# Patient Record
Sex: Male | Born: 1975
Health system: Southern US, Community
[De-identification: ages and names within clinical notes are randomized; demographics above are authoritative.]

## PROBLEM LIST (undated history)

## (undated) DIAGNOSIS — J301 Allergic rhinitis due to pollen: Secondary | ICD-10-CM

## (undated) DIAGNOSIS — U071 COVID-19: Secondary | ICD-10-CM

## (undated) DIAGNOSIS — I872 Venous insufficiency (chronic) (peripheral): Secondary | ICD-10-CM

## (undated) DIAGNOSIS — J329 Chronic sinusitis, unspecified: Secondary | ICD-10-CM

## (undated) DIAGNOSIS — H9313 Tinnitus, bilateral: Secondary | ICD-10-CM

## (undated) DIAGNOSIS — K4090 Unilateral inguinal hernia, without obstruction or gangrene, not specified as recurrent: Secondary | ICD-10-CM

## (undated) DIAGNOSIS — M25511 Pain in right shoulder: Secondary | ICD-10-CM

## (undated) DIAGNOSIS — R42 Dizziness and giddiness: Secondary | ICD-10-CM

## (undated) DIAGNOSIS — Z860101 Personal history of adenomatous and serrated colon polyps: Secondary | ICD-10-CM

## (undated) DIAGNOSIS — Z86010 Personal history of colonic polyps: Secondary | ICD-10-CM

## (undated) DIAGNOSIS — M722 Plantar fascial fibromatosis: Secondary | ICD-10-CM

## (undated) HISTORY — DX: Personal history of colonic polyps: Z86.010

## (undated) HISTORY — DX: Venous insufficiency (chronic) (peripheral): I87.2

## (undated) HISTORY — PX: TONSILLECTOMY: SUR1361

## (undated) HISTORY — DX: Dizziness and giddiness: R42

## (undated) HISTORY — PX: FOOT SURGERY: SHX648

## (undated) HISTORY — PX: ADENOIDECTOMY: SUR15

## (undated) HISTORY — DX: Pain in right shoulder: M25.511

## (undated) HISTORY — DX: Tinnitus, bilateral: H93.13

## (undated) HISTORY — DX: Plantar fascial fibromatosis: M72.2

## (undated) HISTORY — DX: Unilateral inguinal hernia, without obstruction or gangrene, not specified as recurrent: K40.90

## (undated) HISTORY — DX: Allergic rhinitis due to pollen: J30.1

## (undated) HISTORY — DX: Personal history of adenomatous and serrated colon polyps: Z86.0101

## (undated) HISTORY — DX: Chronic sinusitis, unspecified: J32.9

---
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
## 2014-01-10 ENCOUNTER — Encounter: Payer: Self-pay | Admitting: Family Medicine

## 2014-01-10 ENCOUNTER — Ambulatory Visit (INDEPENDENT_AMBULATORY_CARE_PROVIDER_SITE_OTHER): Payer: BC Managed Care – PPO | Admitting: Family Medicine

## 2014-01-10 VITALS — BP 168/77 | HR 67 | Temp 97.9°F | Resp 18 | Ht 71.0 in | Wt 194.0 lb

## 2014-01-10 DIAGNOSIS — Z23 Encounter for immunization: Secondary | ICD-10-CM

## 2014-01-10 DIAGNOSIS — L910 Hypertrophic scar: Secondary | ICD-10-CM

## 2014-01-10 DIAGNOSIS — Z0000 Encounter for general adult medical examination without abnormal findings: Secondary | ICD-10-CM | POA: Insufficient documentation

## 2014-01-10 NOTE — Progress Notes (Signed)
Pre visit review using our clinic review tool, if applicable. No additional management support is needed unless otherwise documented below in the visit note. 

## 2014-01-10 NOTE — Assessment & Plan Note (Signed)
Reviewed age and gender appropriate health maintenance issues (prudent diet, regular exercise, health risks of tobacco and excessive alcohol, use of seatbelts, fire alarms in home, use of sunscreen).  Also reviewed age and gender appropriate health screening as well as vaccine recommendations. Fasting lipid panel and CMET has been ordered for future/[facility]. Tdap and flu vaccine IM today. Referral to dermatology was made due to pt's concern regarding a gradually growing keloid on upper chest wall.

## 2014-01-10 NOTE — Progress Notes (Signed)
Office Note 01/10/2014  CC:  Chief Complaint  Patient presents with  . Establish Care    HPI:  John Henderson is a 38 y.o. White male who is here to establish care. Patient's most recent primary MD: none Old records were not reviewed prior to or during today's visit.  No acute complaints, just wants to get established, get a "baseline physical exam and labs". Denies any past hx of chronic med problems or abnormal lab work in the past.  Past Medical History  Diagnosis Date  . Recurrent sinusitis   . Hay fever     No hx of asthma    Past Surgical History  Procedure Laterality Date  . Tonsillectomy  approx 2005  . Adenoidectomy    . Foot surgery Right     tendon re-attachment after laceration    Family History  Problem Relation Age of Onset  . Bladder Cancer Father     History   Social History  . Marital Status: Married    Spouse Name: N/A    Number of Children: N/A  . Years of Education: N/A   Occupational History  . Not on file.   Social History Main Topics  . Smoking status: Never Smoker   . Smokeless tobacco: Never Used  . Alcohol Use: Yes     Comment: socially  . Drug Use: No  . Sexual Activity: Not on file   Other Topics Concern  . Not on file   Social History Narrative   Married, 2 sons (1 girl on the way as of 12/2013).  Two brothers: healthy.   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   College: Bachelors degree from [employer]   No tob, occ soc alcohol.  No drugs.   Hobbies: fishing, cooking, exercise/golf   MEDS: none today  No Known Allergies  ROS Review of Systems  Constitutional: Negative for fever, chills, appetite change and fatigue.  HENT: Negative for congestion, dental problem, ear pain and sore throat.   Eyes: Negative for discharge, redness and visual disturbance.  Respiratory: Negative for cough, chest tightness, shortness of breath and wheezing.   Cardiovascular: Negative for chest pain, palpitations and leg  swelling.  Gastrointestinal: Negative for nausea, vomiting, abdominal pain, diarrhea and blood in stool.  Genitourinary: Negative for dysuria, urgency, frequency, hematuria, flank pain and difficulty urinating.  Musculoskeletal: Negative for arthralgias, back pain, joint swelling, myalgias and neck stiffness.  Skin: Negative for pallor and rash.       Gradually enlarging scar on upper chest  Neurological: Negative for dizziness, speech difficulty, weakness and headaches.  Hematological: Negative for adenopathy. Does not bruise/bleed easily.  Psychiatric/Behavioral: Negative for confusion and sleep disturbance. The patient is not nervous/anxious.      PE; Blood pressure 168/77, pulse 67, temperature 97.9 F (36.6 C), temperature source Temporal, resp. rate 18, height 5\' 11"  (1.803 m), weight 194 lb (87.998 kg), SpO2 98.00%. Gen: Alert, well appearing.  Patient is oriented to person, place, time, and situation. AFFECT: pleasant, lucid thought and speech. ENT: Ears: EACs clear, normal epithelium.  TMs with good light reflex and landmarks bilaterally.  Eyes: no injection, icteris, swelling, or exudate.  EOMI, PERRLA. Nose: no drainage or turbinate edema/swelling.  No injection or focal lesion.  Mouth: lips without lesion/swelling.  Oral mucosa pink and moist.  Dentition intact and without obvious caries or gingival swelling.  Oropharynx without erythema, exudate, or swelling.  Neck: supple/nontender.  No LAD, mass, or TM.  Carotid pulses 2+ bilaterally, without  bruits. CV: RRR, no m/r/g.   LUNGS: CTA bilat, nonlabored resps, good aeration in all lung fields. ABD: soft, NT, ND, BS normal.  No hepatospenomegaly or mass.  No bruits. EXT: no clubbing, cyanosis, or edema.  Musculoskeletal: no joint swelling, erythema, warmth, or tenderness.  ROM of all joints intact. Skin - no sores or suspicious lesions or rashes or color changes. Upper chest wall with 3 cm long x 1cm wide hypertrophic scar, pinkish  color.  Pertinent labs:  None today  ASSESSMENT AND PLAN:   New pt: no old records to obtain.  Health maintenance examination Reviewed age and gender appropriate health maintenance issues (prudent diet, regular exercise, health risks of tobacco and excessive alcohol, use of seatbelts, fire alarms in home, use of sunscreen).  Also reviewed age and gender appropriate health screening as well as vaccine recommendations. Fasting lipid panel and CMET has been ordered for future/[facility]. Tdap and flu vaccine IM today. Referral to dermatology was made due to pt's concern regarding a gradually growing keloid on upper chest wall.  An After Visit Summary was printed and given to the patient.  Return for 1 yr for CPE.

## 2015-01-14 ENCOUNTER — Ambulatory Visit (INDEPENDENT_AMBULATORY_CARE_PROVIDER_SITE_OTHER): Payer: BLUE CROSS/BLUE SHIELD

## 2015-01-14 ENCOUNTER — Ambulatory Visit (INDEPENDENT_AMBULATORY_CARE_PROVIDER_SITE_OTHER): Payer: BLUE CROSS/BLUE SHIELD | Admitting: Podiatry

## 2015-01-14 DIAGNOSIS — M722 Plantar fascial fibromatosis: Secondary | ICD-10-CM

## 2015-01-14 DIAGNOSIS — M79672 Pain in left foot: Secondary | ICD-10-CM

## 2015-01-14 DIAGNOSIS — M79671 Pain in right foot: Secondary | ICD-10-CM

## 2015-01-14 NOTE — Patient Instructions (Signed)

## 2015-01-14 NOTE — Progress Notes (Signed)
   Subjective:    Patient ID: John Henderson, male    DOB: March 20, 1976, 39 y.o.   MRN: [id number]  HPI Pt presents with bilateral PF, seeking second opinion and other options, has tried ice, cortisone injections, orthotics and stretching and night splint   Review of Systems  All other systems reviewed and are negative.      Objective:   Physical Exam        Assessment & Plan:

## 2015-01-16 NOTE — Progress Notes (Signed)
Subjective:     Patient ID: John Henderson, male   DOB: Dec 21, 1975, 39 y.o.   MRN: [id number]  HPI patient presents stating I have had heel pain in both heels of long-term nature mild degree and I was unsure what might be a good alternative for me. It does not hurt severely but it can bother me if I been active for too long time   Review of Systems  All other systems reviewed and are negative.      Objective:   Physical Exam  Constitutional: He is oriented to person, place, and time.  Cardiovascular: Intact distal pulses.   Musculoskeletal: Normal range of motion.  Neurological: He is oriented to person, place, and time.  Skin: Skin is warm.  Nursing note and vitals reviewed.  neurovascular status found to be intact with muscle strength adequate and range of motion subtalar midtarsal joint within normal limits. Patient has a fairly narrow foot type with stress occurring against the heels and moderate depression of the arch noted upon evaluation     Assessment:     Plantar fasciitis of a moderate nature bilateral with inflammation at the insertion    Plan:     H&P and x-rays reviewed and today I advised on different forms of physical therapy continued orthotic usage as he just received them and if symptoms continue to persist we will need to consider shockwave therapy or other modalities. Patient will be seen back to reevaluate

## 2015-01-22 ENCOUNTER — Ambulatory Visit: Payer: BLUE CROSS/BLUE SHIELD | Admitting: Podiatry

## 2015-09-27 ENCOUNTER — Encounter: Payer: Self-pay | Admitting: Family Medicine

## 2015-09-27 ENCOUNTER — Other Ambulatory Visit: Payer: Self-pay | Admitting: Family Medicine

## 2015-09-27 ENCOUNTER — Ambulatory Visit (INDEPENDENT_AMBULATORY_CARE_PROVIDER_SITE_OTHER): Payer: BLUE CROSS/BLUE SHIELD | Admitting: Family Medicine

## 2015-09-27 VITALS — BP 120/69 | HR 57 | Temp 98.1°F | Resp 20 | Ht 71.0 in | Wt 189.5 lb

## 2015-09-27 DIAGNOSIS — N451 Epididymitis: Secondary | ICD-10-CM | POA: Diagnosis not present

## 2015-09-27 DIAGNOSIS — N50819 Testicular pain, unspecified: Secondary | ICD-10-CM

## 2015-09-27 DIAGNOSIS — R109 Unspecified abdominal pain: Secondary | ICD-10-CM | POA: Diagnosis not present

## 2015-09-27 LAB — POCT URINALYSIS DIPSTICK
BILIRUBIN UA: NEGATIVE
Blood, UA: NEGATIVE
Glucose, UA: NEGATIVE
KETONES UA: NEGATIVE
LEUKOCYTES UA: NEGATIVE
Nitrite, UA: NEGATIVE
PROTEIN UA: NEGATIVE
Spec Grav, UA: 1.015
Urobilinogen, UA: 0.2
pH, UA: 7

## 2015-09-27 MED ORDER — LEVOFLOXACIN 500 MG PO TABS: 500.0000 mg | ORAL_TABLET | Freq: Every day | ORAL | Status: DC

## 2015-09-27 MED ORDER — CEFTRIAXONE SODIUM 1 G IJ SOLR
250.0000 mg | Freq: Once | INTRAMUSCULAR | Status: AC
  Administered 2015-09-27: 250 mg via INTRAMUSCULAR

## 2015-09-27 NOTE — Patient Instructions (Signed)
Start  Antibiotics, this should make your symptoms better unless it i referred pain from a small hernia. If your symptoms are unchanged I want to see you next week, get imaging and send to urology or surgery.

## 2015-09-27 NOTE — Progress Notes (Signed)
Subjective:    Patient ID: John Henderson, male    DOB: [birth year], 39 y.o.   MRN: [id number]  HPI  Testicular discomfort: Patient reports for an acute office visit with 2-3 weeks of "soreness" of his right testes. He states it is a dull intermittent pain. He also endorses mild lower lumbar pain, but is uncertain if this is related as he has had prior history of low back pain. He states sometimes his testicular discomfort is worse when sitting. He feels that he has pressure with urination, and incomplete emptying. He denies any changes into his stream, he denies any burning with urination. He has no history of kidney stones or prior urinary tract infections. He has no recent history of STDs, and is married and in a monogamous relationship. He does have a history of prostatitis years ago. His father had bladder cancer at approximately age 63. He reports mainly feeling a bump on the posterior end of his right testicle. He denies any changes in sexual partners, or penile discharge or lesions. Patient denies any testicular swelling or redness. Patient denies any fevers. Patient denies any trauma or heavy lifting prior to onset of discomfort. Patient denies any pain or difficulty with sexual intercourse.  Past Medical History  Diagnosis Date  . Recurrent sinusitis   . Hay fever     No hx of asthma   No Known Allergies Past Surgical History  Procedure Laterality Date  . Tonsillectomy  approx 2005  . Adenoidectomy    . Foot surgery Right     tendon re-attachment after laceration   Social History   Social History  . Marital Status: Married    Spouse Name: N/A  . Number of Children: N/A  . Years of Education: N/A   Occupational History  . Not on file.   Social History Main Topics  . Smoking status: Never Smoker   . Smokeless tobacco: Never Used  . Alcohol Use: Yes     Comment: socially  . Drug Use: No  . Sexual Activity: Not on file   Other Topics Concern  . Not on file    Social History Narrative   Married, 2 sons (1 girl on the way as of 12/2013).  Two brothers: healthy.   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   College: Bachelors degree from [employer].   No tob, occ soc alcohol.  No drugs.   Hobbies: fishing, cooking, exercise/golf    Review of Systems Negative, with the exception of above mentioned in HPI     Objective:   Physical Exam BP 120/69 mmHg  Pulse 57  Temp(Src) 98.1 F (36.7 C) (Oral)  Resp 20  Ht  (1.803 m)  Wt 189 lb 8 oz (85.957 kg)  BMI 26.44 kg/m2  SpO2 99% Gen: Afebrile. No acute distress. Nontoxic in appearance, well-developed, well-nourished, Caucasian male. Very pleasant. HENT: AT. [place].  MMM. Eyes:Pupils Equal Round Reactive to light, Extraocular movements intact,  Conjunctiva without redness, discharge or icterus. Abd: Soft. Flat. NTND. Mild right inguinal fullness, with mild tender to palpation deep inguinal area. BS  present.  GU: Penile exam normal, no lesions, no erythema, no deformity. Bilateral testes without swelling, erythema. Mild tenderness to palpation posterior right testicle. ? Small right inguinal hernia, mild fullness right inguinal area. No chandelier sign. Normal prostate, smooth, not enlarged. Bilateral Cremasteric reflex intact     Assessment & Plan:  1. Flank pain - Uncertain etiology of flank pain/testicular pain. Palpated very small tender  area posterior right testicle could be consistent with mild epididymitis. Urine dipstick today was normal. We'll send for culture regardless as well as urine cytology. No alarming symptoms on exam today. Discussed treatment with antibiotics to see if improvement in symptoms. Discussed if he did not see any improvement in symptoms within the next few days on antibiotics, we would want to send to urology and consider imaging versus surgical referral for possible small right inguinal hernia. - POCT Urinalysis Dipstick - Urine Culture - Urinalysis, Routine  w reflex microscopic ([facility]) - Cytology - non gyn  2. Testicle pain - Considering epididymitis versus referred pain from possible small inguinal hernia. See above for plan. - Urine Culture - Urinalysis, Routine w reflex microscopic (not [facility]) - Cytology - non gyn  3. Epididymitis - levofloxacin (LEVAQUIN) 500 MG tablet; Take 1 tablet (500 mg total) by mouth daily.  Dispense: 10 tablet; Refill: 0 - cefTRIAXone (ROCEPHIN) injection 250 mg; Inject 0.25 g (250 mg total) into the muscle once.

## 2015-09-28 LAB — URINALYSIS, ROUTINE W REFLEX MICROSCOPIC
BILIRUBIN URINE: NEGATIVE
GLUCOSE, UA: NEGATIVE
HGB URINE DIPSTICK: NEGATIVE
Ketones, ur: NEGATIVE
Leukocytes, UA: NEGATIVE
Nitrite: NEGATIVE
PROTEIN: NEGATIVE
Specific Gravity, Urine: 1.014 (ref 1.001–1.035)
pH: 7 (ref 5.0–8.0)

## 2015-09-28 LAB — URINE CULTURE
COLONY COUNT: NO GROWTH
ORGANISM ID, BACTERIA: NO GROWTH

## 2015-09-30 ENCOUNTER — Telehealth: Payer: Self-pay | Admitting: Family Medicine

## 2015-09-30 LAB — CYTOLOGY - NON PAP

## 2015-09-30 NOTE — Telephone Encounter (Signed)
Please call patient, his urine studies were normal.

## 2015-10-01 NOTE — Telephone Encounter (Signed)
Reviewed results with patient. Patient states he has had some improvement in his Sx . He will continue to take his antibiotic as directed.

## 2015-11-11 ENCOUNTER — Encounter: Payer: Self-pay | Admitting: Family Medicine

## 2015-11-11 ENCOUNTER — Ambulatory Visit (INDEPENDENT_AMBULATORY_CARE_PROVIDER_SITE_OTHER): Payer: BLUE CROSS/BLUE SHIELD | Admitting: Family Medicine

## 2015-11-11 VITALS — BP 118/75 | HR 62 | Temp 98.0°F | Resp 16 | Ht 71.0 in | Wt 187.0 lb

## 2015-11-11 DIAGNOSIS — Z23 Encounter for immunization: Secondary | ICD-10-CM | POA: Diagnosis not present

## 2015-11-11 DIAGNOSIS — R229 Localized swelling, mass and lump, unspecified: Secondary | ICD-10-CM | POA: Diagnosis not present

## 2015-11-11 NOTE — Progress Notes (Signed)
OFFICE VISIT  11/11/2015   CC:  Chief Complaint  Patient presents with  . Cyst    few located on abdomin x 6 months, not painfull     HPI:    Patient is a 39 y.o. Caucasian male who presents for "knots on abdomen". First noted about 6 mo ago x 1 in subQ area of RUQ, no pain, no skin abnormalities, not enlarging--in fact, often hard to feel at all sometimes per pt.  Another one recently palpated by pt in R mid abdomen, nontender, no skin abnormalities.  Not enlarging.  No f/c/malaise.     Past Medical History  Diagnosis Date  . Recurrent sinusitis   . Hay fever     No hx of asthma    Past Surgical History  Procedure Laterality Date  . Tonsillectomy  approx 2005  . Adenoidectomy    . Foot surgery Right     tendon re-attachment after laceration   MEDS: none  No Known Allergies  ROS As per HPI  PE: Blood pressure 118/75, pulse 62, temperature 98 F (36.7 C), temperature source Oral, resp. rate 16, height 5\' 11"  (1.803 m), weight 187 lb (84.823 kg), SpO2 94 %. Gen: Alert, well appearing.  Patient is oriented to person, place, time, and situation. SKIN: abd wall with two pea-sized subQ nodular lesions that are barely discernable from the surrounding tissue upon palpation.  Nontender.  They are not fluctuant.    LABS:  none  IMPRESSION AND PLAN:  Subcutaneous soft tissue lumps x 2, most c/w either small epidermal inclusion cysts or lipomas or neuromas. No sign of infection or worrisome growth/mass. Reassured pt.  Watchful waiting/monitoring. Signs/symptoms to call or return for were reviewed and pt expressed understanding.  An After Visit Summary was printed and given to the patient.  FOLLOW UP: Return if symptoms worsen or fail to improve.

## 2015-11-11 NOTE — Progress Notes (Signed)
Pre visit review using our clinic review tool, if applicable. No additional management support is needed unless otherwise documented below in the visit note. 

## 2015-12-05 ENCOUNTER — Encounter: Payer: Self-pay | Admitting: Family Medicine

## 2015-12-05 ENCOUNTER — Ambulatory Visit (INDEPENDENT_AMBULATORY_CARE_PROVIDER_SITE_OTHER): Payer: BLUE CROSS/BLUE SHIELD | Admitting: Family Medicine

## 2015-12-05 VITALS — BP 122/75 | HR 59 | Temp 98.2°F | Resp 20 | Wt 190.0 lb

## 2015-12-05 DIAGNOSIS — J069 Acute upper respiratory infection, unspecified: Secondary | ICD-10-CM | POA: Diagnosis not present

## 2015-12-05 DIAGNOSIS — J018 Other acute sinusitis: Secondary | ICD-10-CM

## 2015-12-05 MED ORDER — AMOXICILLIN 875 MG PO TABS: 875.0000 mg | ORAL_TABLET | Freq: Two times a day (BID) | ORAL | Status: AC

## 2015-12-05 MED ORDER — PREDNISONE 20 MG PO TABS: ORAL_TABLET | ORAL | Status: DC

## 2015-12-05 NOTE — Progress Notes (Signed)
OFFICE NOTE  12/05/2015  CC:  Chief Complaint  Patient presents with  . Sinusitis   HPI: Patient is a 39 y.o. Caucasian male who is here for respiratory complaints. Onset about 3 d/a, nasal congestion, runny nose, sneezing, cough of PND mucous, some hoarseness. No ST.  No fever.  Feels fatigued.  No body aches.  No rash.  Appetite down a bit. Trying nasacort, neti pot qd.  No antihist or decong.  No facial or upper teeth pain or HA. Pt says sx's are severe compared to his usual cold/allergies flare.    Pertinent PMH:  Past medical, surgical, social, and family history reviewed and no changes are noted since last office visit.  MEDS:  No chronic meds  PE: Blood pressure 122/75, pulse 59, temperature 98.2 F (36.8 C), resp. rate 20, weight 190 lb (86.183 kg), SpO2 97 %. VS: noted--normal. Gen: alert, NAD, NONTOXIC APPEARING. HEENT: eyes without injection, drainage, or swelling.  Ears: EACs clear, TMs with normal light reflex and landmarks.  Nose: Clear rhinorrhea, with some dried, crusty exudate adherent to mildly injected mucosa.  No purulent d/c.  No paranasal sinus TTP.  No facial swelling.  Throat and mouth without focal lesion.  No pharyngial swelling, erythema, or exudate.   Neck: supple, no LAD.   LUNGS: CTA bilat, nonlabored resps.   CV: RRR, no m/r/g. EXT: no c/c/e SKIN: no rash    IMPRESSION AND PLAN:  Severe URI/acute sinusitis. Encouraged pt to continue symptomatic care and give this more time. If not improving any in 4-5 days, he can fill amoxil 875mg  bid x 10d and prednisone 20mg  qd x 3d, then 10mg  qd x 4d. Therapeutic expectations and side effect profile of medication discussed today.  Patient's questions answered. Signs/symptoms to call or return for were reviewed and pt expressed understanding.  An After Visit Summary was printed and given to the patient.  FOLLOW UP: prn

## 2016-04-15 DIAGNOSIS — M71572 Other bursitis, not elsewhere classified, left ankle and foot: Secondary | ICD-10-CM | POA: Diagnosis not present

## 2016-04-15 DIAGNOSIS — M722 Plantar fascial fibromatosis: Secondary | ICD-10-CM | POA: Diagnosis not present

## 2016-04-15 DIAGNOSIS — M71571 Other bursitis, not elsewhere classified, right ankle and foot: Secondary | ICD-10-CM | POA: Diagnosis not present

## 2016-06-15 ENCOUNTER — Ambulatory Visit (INDEPENDENT_AMBULATORY_CARE_PROVIDER_SITE_OTHER): Payer: BLUE CROSS/BLUE SHIELD | Admitting: Family Medicine

## 2016-06-15 ENCOUNTER — Encounter: Payer: Self-pay | Admitting: Family Medicine

## 2016-06-15 VITALS — BP 100/62 | HR 79 | Temp 97.9°F | Resp 16 | Ht 71.0 in | Wt 187.5 lb

## 2016-06-15 DIAGNOSIS — L239 Allergic contact dermatitis, unspecified cause: Secondary | ICD-10-CM

## 2016-06-15 MED ORDER — PREDNISONE 20 MG PO TABS: ORAL_TABLET | ORAL | Status: DC

## 2016-06-15 NOTE — Patient Instructions (Signed)
Use otc generic benadryl (25-50 mg) every 6 hours for itching.  For anti-itch medication that won't cause drowsiness, try OTC generic allegra 180 mg.  For treatment of fungal rash in groin, use otc generic lamisil cream: follow instructions on packaging.

## 2016-06-15 NOTE — Progress Notes (Signed)
OFFICE VISIT  06/15/2016   CC:  Chief Complaint  Patient presents with  . Rash    x 1 day     HPI:    Patient is a 40 y.o. Caucasian male who presents for rash, very itchy. Onset yesterday.  He had recently been cutting down a tree with poison ivy vine on it. Son was doing this with him and son has similar rash.  No fevers, no malaise.  No joint swelling or swelling of eyes, tongue, lips, or throat.   Past Medical History  Diagnosis Date  . Recurrent sinusitis   . Hay fever     No hx of asthma    Past Surgical History  Procedure Laterality Date  . Tonsillectomy  approx 2005  . Adenoidectomy    . Foot surgery Right     tendon re-attachment after laceration    Outpatient Prescriptions Prior to Visit  Medication Sig Dispense Refill  . predniSONE (DELTASONE) 20 MG tablet 1 tab po qd x 3d, then 1/2 tab po qd x 4d (Patient not taking: Reported on 06/15/2016) 5 tablet 0   No facility-administered medications prior to visit.    No Known Allergies  ROS As per HPI  PE: Blood pressure 100/62, pulse 79, temperature 97.9 F (36.6 C), temperature source Oral, resp. rate 16, height 5\' 11"  (1.803 m), weight 187 lb 8 oz (85.049 kg), SpO2 96 %. Gen: Alert, well appearing.  Patient is oriented to person, place, time, and situation. AFFECT: pleasant, lucid thought and speech.  SKIN: forearms and lower abdomen with diffuse erythematous papular rash.  LABS:  none  IMPRESSION AND PLAN:  Allergic contact dermatitis (poison ivy/oak suspected): Prednisone 40mg  qd x 7d, then 20mg  qd x 7d, then 10mg  qd x 6d. Further instructions: Use otc generic benadryl (25-50 mg) every 6 hours for itching.  For anti-itch medication that won't cause drowsiness, try OTC generic allegra 180 mg.  You may continue to use cutivate cream 0.05%.  An After Visit Summary was printed and given to the patient.  FOLLOW UP: Return if symptoms worsen or fail to improve.  Signed:  Santiago BumpersPhil Sadiel Mota, MD            06/15/2016

## 2016-06-15 NOTE — Progress Notes (Signed)
Pre visit review using our clinic review tool, if applicable. No additional management support is needed unless otherwise documented below in the visit note. 

## 2017-01-26 ENCOUNTER — Telehealth: Payer: Self-pay | Admitting: Family Medicine

## 2017-01-26 ENCOUNTER — Encounter: Payer: Self-pay | Admitting: Family Medicine

## 2017-01-26 ENCOUNTER — Ambulatory Visit (INDEPENDENT_AMBULATORY_CARE_PROVIDER_SITE_OTHER): Payer: BLUE CROSS/BLUE SHIELD | Admitting: Family Medicine

## 2017-01-26 VITALS — BP 119/74 | HR 56 | Temp 98.3°F | Resp 20 | Wt 191.2 lb

## 2017-01-26 DIAGNOSIS — R229 Localized swelling, mass and lump, unspecified: Secondary | ICD-10-CM

## 2017-01-26 DIAGNOSIS — R10816 Epigastric abdominal tenderness: Secondary | ICD-10-CM | POA: Diagnosis not present

## 2017-01-26 NOTE — Telephone Encounter (Signed)
Pt has been scheduled to see Dr. Claiborne BillingsKuneff at 3:15am.

## 2017-01-26 NOTE — Telephone Encounter (Signed)
°  Patient Name: John Henderson  DOB: [birth year]    Initial Comment Caller states that he is having chest pain and pressure x 2 weeks   Nurse Assessment  Nurse: Renaldo FiddlerAdkins, RN, Raynelle FanningJulie Date/Time [name](Eastern Time): 01/26/2017 9:55:13 AM  Confirm and document reason for call. If symptomatic, describe symptoms. ---Caller states that he has been having pressure and discomfort under his sternum for the last 2- 3 weeks. States he can see( with deep breath" and feel a protrusion the size of a golf ball. Denies chest pain or difficulty breathing. Adds it has not affected his activity, he has been working out, lifting weights, running, it just feels "weird".  Does the patient have any new or worsening symptoms? ---Yes  Will a triage be completed? ---Yes  Related visit to physician within the last 2 weeks? ---No  Does the PT have any chronic conditions? (i.e. diabetes, asthma, etc.) ---No  Is this a behavioral health or substance abuse call? ---No     Guidelines    Guideline Title Affirmed Question Affirmed Notes  Abdominal Pain - Upper [1] MODERATE pain (e.g., interferes with normal activities) AND [2] comes and goes (cramps) AND [3] present > 24 hours (Exception: pain with Vomiting or Diarrhea - see that Guideline)    Final Disposition User   See Physician within 24 Hours Renaldo FiddlerAdkins, RN, Raynelle FanningJulie    Referrals  REFERRED TO PCP OFFICE   Disagree/Comply: Danella Maiersomply

## 2017-01-26 NOTE — Patient Instructions (Signed)
Lipoma Introduction A lipoma is a noncancerous (benign) tumor that is made up of fat cells. This is a very common type of soft-tissue growth. Lipomas are usually found under the skin (subcutaneous). They may occur in any tissue of the body that contains fat. Common areas for lipomas to appear include the back, shoulders, buttocks, and thighs. Lipomas grow slowly, and they are usually painless. Most lipomas do not cause problems and do not require treatment. What are the causes? The cause of this condition is not known. What increases the risk? This condition is more likely to develop in:  People who are 59-69 years old.  People who have a family history of lipomas. What are the signs or symptoms? A lipoma usually appears as a small, round bump under the skin. It may feel soft or rubbery, but the firmness can vary. Most lipomas are not painful. However, a lipoma may become painful if it is located in an area where it pushes on nerves. How is this diagnosed? A lipoma can usually be diagnosed with a physical exam. You may also have tests to confirm the diagnosis and to rule out other conditions. Tests may include:  Imaging tests, such as a CT scan or MRI.  Removal of a tissue sample to be looked at under a microscope (biopsy). How is this treated? Treatment is not needed for small lipomas that are not causing problems. If a lipoma continues to get bigger or it causes problems, removal is often the best option. Lipomas can also be removed to improve appearance. Removal of a lipoma is usually done with a surgery in which the fatty cells and the surrounding capsule are removed. Most often, a medicine that numbs the area (local anesthetic) is used for this procedure. Follow these instructions at home:  Keep all follow-up visits as directed by your health care provider. This is important. Contact a health care provider if:  Your lipoma becomes larger or hard.  Your lipoma becomes painful, red, or  increasingly swollen. These could be signs of infection or a more serious condition. This information is not intended to replace advice given to you by your health care provider. Make sure you discuss any questions you have with your health care provider. Document Released: 11/13/2002 Document Revised: 04/30/2016 Document Reviewed: 11/19/2014  2017 Elsevier Thoracic Outlet Syndrome Thoracic outlet syndrome (TOS) is a group of signs and symptoms that result when the vein, artery, or nerves that supply your arm and hand are squeezed (compressed). To reach your arm, all of these have to pass through a tight space under your collarbone and above your top rib (thoracic outlet). There are three types of TOS:  Compression of the nerves that supply your arm and hand is called neurogenic TOS. Most people with TOS have this type.  Compression of the vein that returns blood from your arm and hand (subclavian vein) is called venous TOS.  Compression of the artery that carries blood to your arm and hand (subclavian artery) is called arterial TOS. Arterial TOS is the rarest type. Depending on which structures are affected, you may have symptoms on one side or both sides of your body. CAUSES  Neurogenic TOS may be caused by swelling or scarring in your neck muscles that results in the narrowing of your thoracic outlet. This leads to nerve compression. It can happen from:  Neck injuries from an auto accident (whiplash).  Falls.  Repetitive stress on your neck from working with your arms. This stress could  be from using a keyboard all day or working on an Theatre stage [employer].  Venous TOS may be caused by doing hard work with your arms, especially if you have to lift your arms above your head. A blood clot may form in the vein.  Arterial TOS may be caused by having an extra rib at the base of your neck (cervical rib). This rib presses on your subclavian artery. Over time, this pressure may cause a clot to form  inside the artery, or the artery may weaken and balloon outward (aneurysm). RISK FACTORS  You may be at greater risk for neurogenic TOS after a neck injury or repetitive stress on your neck.  You may be at greater risk for venous TOS if you do strenuous and repetitive work with your arms.  You may be at greater risk for arterial TOS if you were born with a cervical rib. Risk factors for any type of TOS include:  Being male.  Being overweight.  Having poor posture. SIGNS AND SYMPTOMS  Your signs and symptoms will depend on the type of TOS that you have. Signs and symptoms of neurogenic TOS may include:  Pain in your shoulder, arm, or hand.  Tingling or numbness in your shoulder, arm, or hand.  Tiredness or weakness of your shoulder, arm, or hand.  Neck pain.  Headache. Signs and symptoms of venous TOS may include:  Pain and swelling of your whole arm.  Arm skin that is darker than usual. Signs and symptoms of arterial TOS may include:  Pain and cramps in your arm or hand.  Pale arm skin.  Very cold hands. All signs and symptoms of TOS may be worse when you hold your arms over your head. DIAGNOSIS Your health care provider may suspect TOS from your symptoms. A physical exam will be done. During the exam, your health care provider may ask you to hold your arms over your head to check whether your symptoms get worse. Tests may also be done to confirm the diagnosis and to find out what is causing TOS. These may include:  Imaging studies, such as:  X-rays to look for a cervical rib.  A test using sound waves to create an image (ultrasound).  CT scan.  MRI.  A test that involves measuring and recording the pulses in your wrists (pulse volume recording).  A test that involves measuring the conduction speed of nerve impulses in your arm (nerve conduction velocity test).  A test in which X-rays are done after dye is injected into your subclavian artery or vein  (venography or arteriography). TREATMENT  Treatment depends on the type of TOS that you have.  Neurogenic TOS may be treated with:  Physical therapy to learn stretching exercises and good posture.  Occupational therapy to improve your workplace and home environment.  Medicine, including pain medicine, muscle relaxants, and anti-inflammatory medicine.  Surgery to remove scarred neck muscles or the first rib. This is rarely done for this type of TOS.  Venous TOS may be treated with:  Medicine, including blood thinners or blood clot dissolvers.  Surgery to remove a blood clot.  Surgery to remove the uppermost rib to make more space in the thoracic outlet.  Arterial TOS may be treated with surgery to:  Remove the cervical rib.  Remove a blood clot (thrombus).  Repair an aneurysm. HOME CARE INSTRUCTIONS  Take medicines only as directed by your health care provider.  Maintain a healthy weight. Lose weight as directed by your  health care provider.  Do stretching exercises at home as directed by your health care provider or physical therapist.  Maintain good posture.  Do not carry heavy bags over your shoulder.  Do not repetitively lift heavy objects over your head.  Take frequent breaks to stretch and rest your arms if you work at a keyboard or do other repetitive work with your hands and arms.  Keep all follow-up visits as directed by your health care provider. This is important. SEEK MEDICAL CARE IF:  You have pain, cramps, numbness, or tingling in your arm or hand.  Your arm or hand frequently feels tired.  Your arm develops a darker skin color than usual.  Your hand feels cold.  You have frequent headaches or neckaches. SEEK IMMEDIATE MEDICAL CARE IF:   You lose feeling in your arm or hand.  You are unable to move your fingers.  Your fingers turn a dark color. This information is not intended to replace advice given to you by your health care provider.  Make sure you discuss any questions you have with your health care provider. Document Released: 11/13/2002 Document Revised: 12/14/2014 Document Reviewed: 04/25/2014 Elsevier Interactive Patient Education  2017 [employer]

## 2017-01-26 NOTE — Progress Notes (Signed)
    John Henderson , 10-19-1976, 41 y.o., male MRN: [id number] Patient Care Team    Relationship Specialty Notifications Start End  Jeoffrey MassedPhilip H McGowen, MD PCP - General Family Medicine  01/10/14   Cherlyn RobertsFrederick Lupton, MD Consulting Physician Dermatology  11/11/15     CC: epigastric pain  Subjective: Pt presents for an acute OV with complaints of epigastric pain  of 3-4 weeks duration.  Associated symptoms include tenderness to palpation and a knot at the location.  He denies heartburn, chest pain, nausea, vomit, decreased appetite, increased symptoms with association of meal. He denies fever or chills.   He also states he has a few skin nodules that he asked about before and was given reassurance, that he was wondering about. It reports they are small, non-tender and present for many years.  No Known Allergies Social History  Substance Use Topics  . Smoking status: Never Smoker  . Smokeless tobacco: Never Used  . Alcohol use Yes     Comment: socially   Past Medical History:  Diagnosis Date  . Hay fever    No hx of asthma  . Recurrent sinusitis    Past Surgical History:  Procedure Laterality Date  . ADENOIDECTOMY    . FOOT SURGERY Right    tendon re-attachment after laceration  . TONSILLECTOMY  approx 2005   Family History  Problem Relation Age of Onset  . Bladder Cancer Father    Allergies as of 01/26/2017   No Known Allergies     Medication List    as of 01/26/2017  3:22 PM   You have not been prescribed any medications.     No results found for this or any previous visit (from the past 24 hour(s)). No results found.   ROS: Negative, with the exception of above mentioned in HPI   Objective:  BP 119/74 (BP Location: Left Arm, Patient Position: Sitting, Cuff Size: Large)   Pulse (!) 56   Temp 98.3 F (36.8 C)   Resp 20   Wt 191 lb 4 oz (86.8 kg)   SpO2 98%   BMI 26.67 kg/m  Body mass index is 26.67 kg/m. Gen: Afebrile. No acute distress. Nontoxic in  appearance, well developed, well nourished.  HENT: AT. [place].  MMM Eyes:Pupils Equal Round Reactive to light, Extraocular movements intact,  Conjunctiva without redness, discharge or icterus. CV: RRR  Chest: CTAB, no wheeze or crackles.  Abd: Soft. Flat. ND. Very mild TTP posterior to xiphoid process. BS present. no Masses palpated. No rebound or guarding.  Skin: no skin irritation or erythema, very small hard mobile < pea size nodules x3. No tenderness.    Assessment/Plan: John RhodyBernard Henderson is a 41 y.o. male present for acute OV for  Epigastric abdominal tenderness without rebound tenderness - mild tenderness just posterior to xiphoid process. Do not think this concerning or formation of a hernia etc. He has no other symptoms. If it is a a hernia it is very small and would not suggest further investigation unless worsening. Increase discomfort is likely from him pushing on it.   Skin nodule x3 - reassured, possible small lipoma vs fat necrosis/area of prior trauma.    electronically signed by:  Felix Pacinienee Kaipo Ardis, DO  [facility] - [place]

## 2017-04-21 ENCOUNTER — Ambulatory Visit (INDEPENDENT_AMBULATORY_CARE_PROVIDER_SITE_OTHER): Payer: BLUE CROSS/BLUE SHIELD | Admitting: Family Medicine

## 2017-04-21 ENCOUNTER — Encounter: Payer: Self-pay | Admitting: Family Medicine

## 2017-04-21 VITALS — BP 110/70 | HR 53 | Temp 98.0°F | Resp 20 | Wt 188.0 lb

## 2017-04-21 DIAGNOSIS — W57XXXA Bitten or stung by nonvenomous insect and other nonvenomous arthropods, initial encounter: Secondary | ICD-10-CM

## 2017-04-21 DIAGNOSIS — S30861A Insect bite (nonvenomous) of abdominal wall, initial encounter: Secondary | ICD-10-CM | POA: Diagnosis not present

## 2017-04-21 MED ORDER — DOXYCYCLINE HYCLATE 100 MG PO TABS: 100.0000 mg | ORAL_TABLET | Freq: Two times a day (BID) | ORAL | 0 refills | Status: DC

## 2017-04-21 NOTE — Progress Notes (Signed)
John Henderson , 1976/11/05, 41 y.o., male MRN: [id number] Patient Care Team    Relationship Specialty Notifications Start End  McGowen, Maryjean MornPhilip H, MD PCP - General Family Medicine  01/10/14   Cherlyn RobertsLupton, Frederick, MD Consulting Physician Dermatology  11/11/15     Chief Complaint  Patient presents with  . Insect Bite    tick abdomen     Subjective: Pt presents for an OV with complaints of insect bite of 3-4  days duration.  Associated symptoms include itchy,  swelling, redness at site and a red ring around bite. He has seen many ticks on his kids. [place] They have a dog. He denies fever, chills, headache, rash or weakness.   No flowsheet data found.  No Known Allergies Social History  Substance Use Topics  . Smoking status: Never Smoker  . Smokeless tobacco: Never Used  . Alcohol use Yes     Comment: socially   Past Medical History:  Diagnosis Date  . Hay fever    No hx of asthma  . Recurrent sinusitis    Past Surgical History:  Procedure Laterality Date  . ADENOIDECTOMY    . FOOT SURGERY Right    tendon re-attachment after laceration  . TONSILLECTOMY  approx 2005   Family History  Problem Relation Age of Onset  . Bladder Cancer Father    Allergies as of 04/21/2017   No Known Allergies     Medication List    as of 04/21/2017  1:53 PM   You have not been prescribed any medications.     All past medical history, surgical history, allergies, family history, immunizations andmedications were updated in the EMR today and reviewed under the history and medication portions of their EMR.     ROS: Negative, with the exception of above mentioned in HPI   Objective:  BP 110/70 (BP Location: Left Arm, Patient Position: Sitting, Cuff Size: Large)   Pulse (!) 53   Temp 98 F (36.7 C)   Resp 20   Wt 188 lb (85.3 kg)   SpO2 97%   BMI 26.22 kg/m  Body mass index is 26.22 kg/m. Gen: Afebrile. No acute distress. Nontoxic in appearance, well developed,  well nourished.  HENT: AT. [place]. MMM Eyes:Pupils Equal Round Reactive to light, Extraocular movements intact,  Conjunctiva without redness, discharge or icterus. Skin:  no rashes, purpura or petechiinsect bite left of midline lower abd. Redness, swelling of bite with red ring. ae.  Neuro:Normal gait. PERLA. EOMi. Alert. Oriented x3   No exam data present No results found. No results found for this or any previous visit (from the past 24 hour(s)).  Assessment/Plan: John RhodyBernard Henderson is a 41 y.o. male present for OV for  1. Tick bite, initial encounter - given duration outside the window for prophylaxis dose and area does appear to have bulls eye ring with cover with doxy BID for 10 days.  Monitor for additional rash formation, headache, fever.  - F/u PRN    Reviewed expectations re: course of current medical issues.  Discussed self-management of symptoms.  Outlined signs and symptoms indicating need for more acute intervention.  Patient verbalized understanding and all questions were answered.  Patient received an After-Visit Summary.   Note is dictated utilizing voice recognition software. Although note has been proof read prior to signing, occasional typographical errors still can be missed. If any questions arise, please do not hesitate to call for verification.   electronically signed by:  Luster Landsbergenee  Raoul Pitch, DO  [facility] - [place]

## 2017-04-21 NOTE — Patient Instructions (Signed)
Tick Bite Information Introduction Ticks are insects that attach themselves to the skin. There are many types of ticks. Common types include wood ticks and deer ticks. Sometimes, ticks carry diseases that can make a person very ill. The most common places for ticks to attach themselves are the scalp, neck, armpits, waist, and groin. HOW CAN YOU PREVENT TICK BITES? Take these steps to help prevent tick bites when you are outdoors:  Wear long sleeves and long pants.  Wear white clothes so you can see ticks more easily.  Tuck your pant legs into your socks.  If walking on a trail, stay in the middle of the trail to avoid brushing against bushes.  Avoid walking through areas with long grass.  Put bug spray on all skin that is showing and along boot tops, pant legs, and sleeve cuffs.  Check clothes, hair, and skin often and before going inside.  Brush off any ticks that are not attached.  Take a shower or bath as soon as possible after being outdoors. HOW SHOULD YOU REMOVE A TICK? Ticks should be removed as soon as possible to help prevent diseases. 1. If latex gloves are available, put them on before trying to remove a tick. 2. Use tweezers to grasp the tick as close to the skin as possible. You may also use curved forceps or a tick removal tool. Grasp the tick as close to its head as possible. Avoid grasping the tick on its body. 3. Pull gently upward until the tick lets go. Do not twist the tick or jerk it suddenly. This may break off the tick's head or mouth parts. 4. Do not squeeze or crush the tick's body. This could force disease-carrying fluids from the tick into your body. 5. After the tick is removed, wash the bite area and your hands with soap and water or alcohol. 6. Apply a small amount of antiseptic cream or ointment to the bite site. 7. Wash any tools that were used. Do not try to remove a tick by applying a hot match, petroleum jelly, or fingernail polish to the tick. These  methods do not work. They may also increase the chances of disease being spread from the tick bite. WHEN SHOULD YOU SEEK HELP? Contact your health care provider if you are unable to remove a tick or if a part of the tick breaks off in the skin. After a tick bite, you need to watch for signs and symptoms of diseases that can be spread by ticks. Contact your health care provider if you develop any of the following:  Fever.  Rash.  Redness and puffiness (swelling) in the area of the tick bite.  Tender, puffy lymph glands.  Watery poop (diarrhea).  Weight loss.  Cough.  Feeling more tired than normal (fatigue).  Muscle, joint, or bone pain.  Belly (abdominal) pain.  Headache.  Change in your level of consciousness.  Trouble walking or moving your legs.  Loss of feeling (numbness) in the legs.  Loss of movement (paralysis).  Shortness of breath.  Confusion.  Throwing up (vomiting) many times. This information is not intended to replace advice given to you by your health care provider. Make sure you discuss any questions you have with your health care provider. Document Released: 02/17/2010 Document Revised: 04/30/2016 Document Reviewed: 05/03/2013 Elsevier Interactive Patient Education  2017 Elsevier Inc.  Doxycyline every 12 hours for 10 days.

## 2017-05-13 NOTE — Progress Notes (Signed)
Noted  

## 2017-06-03 ENCOUNTER — Ambulatory Visit (INDEPENDENT_AMBULATORY_CARE_PROVIDER_SITE_OTHER): Payer: BLUE CROSS/BLUE SHIELD | Admitting: Family Medicine

## 2017-06-03 ENCOUNTER — Encounter: Payer: Self-pay | Admitting: Family Medicine

## 2017-06-03 VITALS — BP 108/55 | HR 56 | Temp 98.1°F | Resp 16 | Ht 71.0 in | Wt 190.5 lb

## 2017-06-03 DIAGNOSIS — K4090 Unilateral inguinal hernia, without obstruction or gangrene, not specified as recurrent: Secondary | ICD-10-CM

## 2017-06-03 NOTE — Progress Notes (Signed)
OFFICE VISIT  06/03/2017   CC:  Chief Complaint  Patient presents with  . Hernia   HPI:    Patient is a 41 y.o. Caucasian male who presents for pt's suspicion of groin hernia. Onset 2 d/a, was doing leg extensions, felt some groin discomfort briefly, then noted it continued to feel a bit different on the left.  He felt a bump in L groin last night that "came and went".  Says he has no pain now, just mildly uncomfortable at times. No problem with eating/drinking or BMs.  No abd pain.  Past Medical History:  Diagnosis Date  . Hay fever    No hx of asthma  . Recurrent sinusitis     Past Surgical History:  Procedure Laterality Date  . ADENOIDECTOMY    . FOOT SURGERY Right    tendon re-attachment after laceration  . TONSILLECTOMY  approx 2005    Outpatient Medications Prior to Visit  Medication Sig Dispense Refill  . doxycycline (VIBRA-TABS) 100 MG tablet Take 1 tablet (100 mg total) by mouth 2 (two) times daily. (Patient not taking: Reported on 06/03/2017) 20 tablet 0   No facility-administered medications prior to visit.     No Known Allergies  ROS As per HPI  PE: Blood pressure (!) 108/55, pulse (!) 56, temperature 98.1 F (36.7 C), temperature source Oral, resp. rate 16, height 5\' 11"  (1.803 m), weight 190 lb 8 oz (86.4 kg), SpO2 96 %. Gen: Alert, well appearing.  Patient is oriented to person, place, time, and situation. AFFECT: pleasant, lucid thought and speech. ABD: soft, NT/ND, BS normal. Groin: small L inguinal bulge noted on palpation--not visible on inspection.  With coughing this pushes out some but returns to baseline.  I can feel it protrude a bit indirectly with cough.  With lying supine, exam is essentially the same, with the small hernia being easily reducible.  No tenderness on exam, no discoloration of abd wall or scrotum.  LABS:    Chemistry   No results found for: NA, K, CL, CO2, BUN, CREATININE, GLU No results found for: CALCIUM, ALKPHOS, AST, ALT,  BILITOT    IMPRESSION AND PLAN:  Left groin hernia: I can palpate this direct and slightly indirect.  Easily reducible. Discussed options of watchful waiting (Signs/symptoms to call or return for were reviewed and pt expressed understanding) approach vs referral to gen surg for consideration of elective repair. Pt comfortable with watchful waiting approach at this time since it is bothering him minimally. He knows to call or return if he has new sx's or changes his mind.  An After Visit Summary was printed and given to the patient.  FOLLOW UP: Return if symptoms worsen or fail to improve.  Signed:  Santiago BumpersPhil Jakayden Cancio, MD           06/03/2017

## 2017-06-06 DIAGNOSIS — K4090 Unilateral inguinal hernia, without obstruction or gangrene, not specified as recurrent: Secondary | ICD-10-CM

## 2017-06-06 HISTORY — DX: Unilateral inguinal hernia, without obstruction or gangrene, not specified as recurrent: K40.90

## 2017-06-10 ENCOUNTER — Telehealth: Payer: Self-pay | Admitting: Family Medicine

## 2017-06-10 DIAGNOSIS — K4090 Unilateral inguinal hernia, without obstruction or gangrene, not specified as recurrent: Secondary | ICD-10-CM

## 2017-06-10 NOTE — Telephone Encounter (Signed)
Please advise. Thanks.  

## 2017-06-10 NOTE — Telephone Encounter (Signed)
Patient states he was seen last week by pcp and was advised to call back if he decided to proceed with referral to surgeon for hernia.  Patient would like referral to see a surgeon.  Please enter referral.

## 2017-06-13 ENCOUNTER — Encounter: Payer: Self-pay | Admitting: Family Medicine

## 2017-06-13 NOTE — Telephone Encounter (Signed)
OK.  Referral to Gen Surg ordered.

## 2017-06-14 NOTE — Telephone Encounter (Signed)
Left detailed message on pts cell vm, okay per DPR.  

## 2017-07-07 DIAGNOSIS — K4090 Unilateral inguinal hernia, without obstruction or gangrene, not specified as recurrent: Secondary | ICD-10-CM | POA: Diagnosis not present

## 2017-08-19 DIAGNOSIS — S0500XA Injury of conjunctiva and corneal abrasion without foreign body, unspecified eye, initial encounter: Secondary | ICD-10-CM | POA: Diagnosis not present

## 2017-08-19 DIAGNOSIS — H1131 Conjunctival hemorrhage, right eye: Secondary | ICD-10-CM | POA: Diagnosis not present

## 2017-09-22 DIAGNOSIS — F4320 Adjustment disorder, unspecified: Secondary | ICD-10-CM | POA: Diagnosis not present

## 2018-07-04 ENCOUNTER — Telehealth: Payer: Self-pay | Admitting: Family Medicine

## 2018-07-04 NOTE — Telephone Encounter (Signed)
Copied from CRM [id number]. Topic: Appointment Scheduling - Scheduling Inquiry for Clinic >> Jul 04, 2018  1:06 PM Burchel, Abbi R wrote: Reason for CRM:   Pt wants to transfer care to Dr Claiborne BillingsKuneff.  Pt also wants to sched a Physical with her.     Pt: [phone]

## 2018-07-06 NOTE — Telephone Encounter (Signed)
Called patient and advised that we do not transfer patients between our two providers her given the size of the office. I also offered to help him transfer to a provider at another location if he felt that that might be a better fit for him.

## 2018-10-18 ENCOUNTER — Ambulatory Visit: Payer: Self-pay

## 2018-10-18 NOTE — Telephone Encounter (Signed)
Incoming call from Patient with complaint of intermittent abdominal pain for 90 days.  Patient states it hurts in the lower abdomin.  Does not radiate.  Patient does not know what may be causing the pain.  States there are no relieving nor aggravating/ factors relating to the stomach pain.  Denies vomiting diarrhea or constipation or urine problems.   Patient, scheduled for acute appointment tomorrow 10/19/18  With check in at 9:45 for an 10:00 appointment.  Patient voiced understanding.  Reviewed care advice with patient he voiced understanding.  Patient plans to schedule an physical while at the office tomorrow.   .   Reason for Disposition . Abdominal pain is a chronic symptom (recurrent or ongoing AND present > 4 weeks)  Answer Assessment - Initial Assessment Questions 1. LOCATION: "Where does it hurt?"      lower 2. RADIATION: "Does the pain shoot anywhere else?" (e.g., chest, back)     nonoe 3. ONSET: "When did the pain begin?" (Minutes, hours or days ago)      90 days ago 4. SUDDEN: "Gradual or sudden onset?"     Comes and goes low grade dull pain.   5. PATTERN "Does the pain come and go, or is it constant?"    - If constant: "Is it getting better, staying the same, or worsening?"      (Note: Constant means the pain never goes away completely; most serious pain is constant and it progresses)     - If intermittent: "How long does it last?" "Do you have pain now?"     (Note: Intermittent means the pain goes away completely between bouts)     intermittent 6. SEVERITY: "How bad is the pain?"  (e.g., Scale 1-10; mild, moderate, or severe)    - MILD (1-3): doesn't interfere with normal activities, abdomen soft and not tender to touch     - MODERATE (4-7): interferes with normal activities or awakens from sleep, tender to touch     - SEVERE (8-10): excruciating pain, doubled over, unable to do any normal activities        Mild pain 7. RECURRENT SYMPTOM: "Have you ever had this type of  abdominal pain before?" If so, ask: "When was the last time?" and "What happened that time?"      90 days ago 8. CAUSE: "What do you think is causing the abdominal pain?"     I dont 9. RELIEVING/AGGRAVATING FACTORS: "What makes it better or worse?" (e.g., movement, antacids, bowel movement)     Not that Im aware of.   10. OTHER SYMPTOMS: "Has there been any vomiting, diarrhea, constipation, or urine problems?"       no  Protocols used: ABDOMINAL PAIN - MALE-A-AH

## 2018-10-19 ENCOUNTER — Encounter: Payer: BLUE CROSS/BLUE SHIELD | Admitting: Family Medicine

## 2018-10-19 ENCOUNTER — Ambulatory Visit: Payer: BLUE CROSS/BLUE SHIELD | Admitting: Family Medicine

## 2018-10-19 ENCOUNTER — Encounter: Payer: Self-pay | Admitting: Family Medicine

## 2018-10-19 ENCOUNTER — Ambulatory Visit (INDEPENDENT_AMBULATORY_CARE_PROVIDER_SITE_OTHER): Payer: BLUE CROSS/BLUE SHIELD | Admitting: Family Medicine

## 2018-10-19 VITALS — BP 117/80 | HR 63 | Temp 98.3°F | Resp 16 | Ht 71.0 in | Wt 199.0 lb

## 2018-10-19 DIAGNOSIS — R102 Pelvic and perineal pain: Secondary | ICD-10-CM

## 2018-10-19 DIAGNOSIS — Z0000 Encounter for general adult medical examination without abnormal findings: Secondary | ICD-10-CM

## 2018-10-19 LAB — LIPID PANEL
CHOL/HDL RATIO: 3
Cholesterol: 215 mg/dL — ABNORMAL HIGH (ref 0–200)
HDL: 71.9 mg/dL (ref >39.00)
LDL CALC: 127 mg/dL — AB (ref 0–99)
NonHDL: 142.87
Triglycerides: 80 mg/dL (ref 0.0–149.0)
VLDL: 16 mg/dL (ref 0.0–40.0)

## 2018-10-19 LAB — POCT URINALYSIS DIPSTICK
Bilirubin, UA: NEGATIVE
Blood, UA: NEGATIVE
Glucose, UA: NEGATIVE
Ketones, UA: NEGATIVE
Leukocytes, UA: NEGATIVE
NITRITE UA: NEGATIVE
PH UA: 6 (ref 5.0–8.0)
PROTEIN UA: NEGATIVE
Spec Grav, UA: 1.02 (ref 1.010–1.025)
UROBILINOGEN UA: 0.2 U/dL

## 2018-10-19 LAB — CBC WITH DIFFERENTIAL/PLATELET
BASOS ABS: 0 10*3/uL (ref 0.0–0.1)
Basophils Relative: 1 % (ref 0.0–3.0)
Eosinophils Absolute: 0.1 10*3/uL (ref 0.0–0.7)
Eosinophils Relative: 1.6 % (ref 0.0–5.0)
HEMATOCRIT: 46.4 % (ref 39.0–52.0)
HEMOGLOBIN: 15.6 g/dL (ref 13.0–17.0)
LYMPHS PCT: 40.5 % (ref 12.0–46.0)
Lymphs Abs: 1.7 10*3/uL (ref 0.7–4.0)
MCHC: 33.7 g/dL (ref 30.0–36.0)
MCV: 96 fl (ref 78.0–100.0)
Monocytes Absolute: 0.3 10*3/uL (ref 0.1–1.0)
Monocytes Relative: 7.8 % (ref 3.0–12.0)
Neutro Abs: 2.1 10*3/uL (ref 1.4–7.7)
Neutrophils Relative %: 49.1 % (ref 43.0–77.0)
Platelets: 276 10*3/uL (ref 150.0–400.0)
RBC: 4.84 Mil/uL (ref 4.22–5.81)
RDW: 13 % (ref 11.5–15.5)
WBC: 4.3 10*3/uL (ref 4.0–10.5)

## 2018-10-19 LAB — COMPREHENSIVE METABOLIC PANEL
ALT: 23 U/L (ref 0–53)
AST: 30 U/L (ref 0–37)
Albumin: 5 g/dL (ref 3.5–5.2)
Alkaline Phosphatase: 45 U/L (ref 39–117)
BILIRUBIN TOTAL: 0.8 mg/dL (ref 0.2–1.2)
BUN: 15 mg/dL (ref 6–23)
CHLORIDE: 102 meq/L (ref 96–112)
CO2: 31 meq/L (ref 19–32)
Calcium: 9.8 mg/dL (ref 8.4–10.5)
Creatinine, Ser: 1.03 mg/dL (ref 0.40–1.50)
GFR: 83.85 mL/min (ref >60.00)
Glucose, Bld: 95 mg/dL (ref 70–99)
Potassium: 4.5 mEq/L (ref 3.5–5.1)
Sodium: 139 mEq/L (ref 135–145)
Total Protein: 7.2 g/dL (ref 6.0–8.3)

## 2018-10-19 LAB — TSH: TSH: 1.58 u[IU]/mL (ref 0.35–4.50)

## 2018-10-19 NOTE — Progress Notes (Signed)
OFFICE VISIT  10/19/2018   CC:  Chief Complaint  Patient presents with  . Abdominal Pain    low abdominal pain, hx of Left side hernia groin   HPI:    Patient is a 42 y.o. Caucasian male who presents for abdominal pain. Onset about 3-4 mo ago, dull, located in suprapubic region.  Waxing and waning, very mild intensity, centrally located with very rare movement to either side of the lower abdomen.  No nausea.  No diarrhea or constipation.  No melena or hematochezia.  No fevers.  No malaise or fatigue.  No dysuria, no abnl smell, no gross hematuria.  Urinates once at night, some dribbling, o/w no sx's of LUT obst.,    He has hx of left inguinal hernia and says he has not felt this in a few months.  No abd bloating.  No tenderness in groin area.   Episodes of this pain usually lasts 1-2 days, and sometimes the episodes are a couple of weeks apart.  +FH of bladder cancer (father), so pt has concerns about this.   Past Medical History:  Diagnosis Date  . Hay fever    No hx of asthma  . Inguinal hernia, left 06/2017   Referred to Gen Surg 06/13/17  . Recurrent sinusitis     Past Surgical History:  Procedure Laterality Date  . ADENOIDECTOMY    . FOOT SURGERY Right    tendon re-attachment after laceration  . TONSILLECTOMY  approx 2005    No outpatient medications prior to visit.   No facility-administered medications prior to visit.     No Known Allergies  ROS As per HPI  PE: Blood pressure 117/80, pulse 63, temperature 98.3 F (36.8 C), temperature source Oral, resp. rate 16, height 5\' 11"  (1.803 m), weight 199 lb (90.3 kg), SpO2 99 %. Gen: Alert, well appearing.  Patient is oriented to person, place, time, and situation. AFFECT: pleasant, lucid thought and speech. CV: RRR, no m/r/g.   LUNGS: CTA bilat, nonlabored resps, good aeration in all lung fields. ABD: soft, mild discomfort in suprapubic region but NOT over the pubic bone or pubic symphysis.  ND, BS normal.  No  hepatospenomegaly or mass.  No bruits. Rectal exam: negative without mass, lesions or tenderness, PROSTATE EXAM: smooth and symmetric without nodules or tenderness.    LABS:  No results found for: TSH No results found for: WBC, HGB, HCT, MCV, PLT No results found for: CREATININE, BUN, NA, K, CL, CO2 No results found for: ALT, AST, GGT, ALKPHOS, BILITOT No results found for: CHOL No results found for: HDL No results found for: LDLCALC No results found for: TRIG No results found for: CHOLHDL No results found for: PSA  UA today: normal.  IMPRESSION AND PLAN:   Nonspecific suprapubic pain. No good evidence from hx of exam that any LUT obstruction is occurring and no GI source suspected. Urine normal.  Clx sent for completeness. Reassured. Watchful waiting approach.  Offered urol referral but he declined for now. Signs/symptoms to call or return for were reviewed and pt expressed understanding.  He asked for fasting HP labs b/c we ordered these at his last CPE and he never returned to get them.  He is fasting today.  An After Visit Summary was printed and given to the patient.  FOLLOW UP: No follow-ups on file.  Signed:  Santiago BumpersPhil McGowen, MD           10/19/2018

## 2018-10-20 LAB — URINE CULTURE
MICRO NUMBER:: 91367795
SPECIMEN QUALITY:: ADEQUATE

## 2018-11-06 DIAGNOSIS — M25511 Pain in right shoulder: Secondary | ICD-10-CM

## 2018-11-06 HISTORY — DX: Pain in right shoulder: M25.511

## 2018-11-11 ENCOUNTER — Ambulatory Visit (INDEPENDENT_AMBULATORY_CARE_PROVIDER_SITE_OTHER): Payer: BLUE CROSS/BLUE SHIELD | Admitting: Family Medicine

## 2018-11-11 ENCOUNTER — Other Ambulatory Visit: Payer: Self-pay

## 2018-11-11 ENCOUNTER — Encounter: Payer: Self-pay | Admitting: Family Medicine

## 2018-11-11 VITALS — BP 140/85 | HR 63 | Temp 98.5°F | Resp 18 | Ht 71.0 in | Wt 205.0 lb

## 2018-11-11 DIAGNOSIS — Z23 Encounter for immunization: Secondary | ICD-10-CM

## 2018-11-11 DIAGNOSIS — B356 Tinea cruris: Secondary | ICD-10-CM | POA: Diagnosis not present

## 2018-11-11 DIAGNOSIS — M7521 Bicipital tendinitis, right shoulder: Secondary | ICD-10-CM | POA: Diagnosis not present

## 2018-11-11 MED ORDER — CICLOPIROX OLAMINE 0.77 % EX CREA: TOPICAL_CREAM | Freq: Two times a day (BID) | CUTANEOUS | 0 refills | Status: DC

## 2018-11-11 MED ORDER — NAPROXEN 500 MG PO TABS: 500.0000 mg | ORAL_TABLET | Freq: Two times a day (BID) | ORAL | 0 refills | Status: DC

## 2018-11-11 NOTE — Patient Instructions (Addendum)
Start naproxen every 12 hours for 5 days with food.  I believe you strained either your rotator cuff or bicep tendon.   If you have any pain or weakness after 2 weeks of no lifting and the treatment above, then follow you for further intervention.   I have called in the cream- use 5 days after rash gone.  New towel and wash cloth daily.  If not resolved on 4 weeks--> stop cream and follow up w/ Dr. Milinda CaveMcGowen to investigate other causes.    Jock Itch Jock itch is an infection of the skin in the groin area. It is caused by a type of germ (fungus). The infection causes a rash and itching in the groin and upper thigh. It is common in people who play sports. Being in places with hot weather and wearing tight or wet clothes can increase the chance of getting jock itch. The rash usually goes away in 2-3 weeks with treatment. Follow these instructions at home:  Take medicines only as told by your doctor. Apply skin creams or ointments exactly as told.  Wear loose-fitting clothing. ? Men should wear cotton boxer shorts. ? Women should wear cotton underwear.  Change your underwear every day to keep your groin dry.  Avoid hot baths.  Dry your groin area well after you take a bath or shower. ? Use a separate towel to dry your groin area. This will help to prevent a spreading of the infection to other areas of your body.  Do not scratch the affected area.  Do not share towels with other people. Contact a doctor if:  Your rash does not improve or it gets worse after 2 weeks of treatment.  Your rash is spreading.  Your rash comes back after treatment is finished.  You have a fever.  You have redness, swelling, or pain in the area around your rash.  You have fluid, blood, or pus coming from your rash.  Your have your rash for more than 4 weeks. This information is not intended to replace advice given to you by your health care provider. Make sure you discuss any questions you have with  your health care provider. Document Released: 02/17/2010 Document Revised: 04/30/2016 Document Reviewed: 09/04/2014 Elsevier Interactive Patient Education  [employer]2018 Elsevier Inc.

## 2018-11-11 NOTE — Progress Notes (Signed)
John Henderson , Jun 25, 1976, 42 y.o., male MRN: [id number] Patient Care Team    Relationship Specialty Notifications Start End  McGowen, Maryjean Morn, MD PCP - General Family Medicine  01/10/14   Cherlyn Roberts, MD Consulting Physician Dermatology  11/11/15     Chief Complaint  Patient presents with  . Shoulder Pain    Right Shoulder pain after wrestling/playing around with friends.      Subjective: Pt presents for an OV with complaints of right shoulder pain of 2 days  duration.  Associated symptoms include pain with extending arm. Pain started a few days ago after he was wrestling with one of his friends. Did not feel an injury, but a few minutes after he noticed his shoulder hurt  With raising arm. He denies swelling or redness.  As a teenager he had issues for about 6 months with his shoulder from football, but it self resolved.  Pt has tried ibuprofen. to ease their symptoms, it was not that helpful. He does admit it feels better today than yesterday.    He also endorses Jock itch infection that has intermittent  Over the last few years and never goes completely away. He has tried OTC creams and it has not been helpful this time.   No flowsheet data found.  No Known Allergies Social History   Tobacco Use  . Smoking status: Never Smoker  . Smokeless tobacco: Never Used  Substance Use Topics  . Alcohol use: Yes    Comment: socially   Past Medical History:  Diagnosis Date  . Hay fever    No hx of asthma  . Inguinal hernia, left 06/2017   Referred to Gen Surg 06/13/17  . Recurrent sinusitis    Past Surgical History:  Procedure Laterality Date  . ADENOIDECTOMY    . FOOT SURGERY Right    tendon re-attachment after laceration  . TONSILLECTOMY  approx 2005   Family History  Problem Relation Age of Onset  . Bladder Cancer Father    Allergies as of 11/11/2018   No Known Allergies     Medication List    as of 11/11/2018  9:15 AM   You have not been prescribed any  medications.     All past medical history, surgical history, allergies, family history, immunizations andmedications were updated in the EMR today and reviewed under the history and medication portions of their EMR.     ROS: Negative, with the exception of above mentioned in HPI   Objective:  BP 140/85   Pulse 63   Temp 98.5 F (36.9 C) (Oral)   Resp 18   Ht 5\' 11"  (1.803 m)   Wt 205 lb (93 kg)   SpO2 95%   BMI 28.59 kg/m  Body mass index is 28.59 kg/m. Gen: Afebrile. No acute distress. Nontoxic in appearance, well developed, well nourished.  HENT: AT. [place].  MMM Eyes:Pupils Equal Round Reactive to light, Extraocular movements intact,  Conjunctiva without redness, discharge or icterus. MSK: no erythema, no soft tissue swelling, Norm abduction of bilateral arms. Discomfort with extension. TTP bicep groove. + empty can test. Negative hawkins, negative lift off test. No pain with resisted flexion. NV intact distally.  Skin: no rashes, purpura or petechiae.  Neuro:  Normal gait. PERLA. EOMi. Alert. Oriented x3   No exam data present No results found. No results found for this or any previous visit (from the past 24 hour(s)).  Assessment/Plan: John Henderson is a 42 y.o. male present for OV  for  Biceps tendinitis of right upper extremity - rest, no heavy lifting. Ice. Nsaids- naproxen BID prescribed scheduled with food BID 5-7 days. - if not resolved or worsening f/u 2 weeks.    Tinea cruris Discussed hygiene. New towel/wash cloth with ea shower.  Loprox cream prescribed.  Tx for 5-7 days even after rash is resolved.  If rash remains after 4 weeks of cream use- he is to be seen with his PCP for to rule out other potential diagnosis.   Need for immunization against influenza - Flu Vaccine QUAD 36+ mos IM   Reviewed expectations re: course of current medical issues.  Discussed self-management of symptoms.  Outlined signs and symptoms indicating need for more acute  intervention.  Patient verbalized understanding and all questions were answered.  Patient received an After-Visit Summary.    No orders of the defined types were placed in this encounter.    Note is dictated utilizing voice recognition software. Although note has been proof read prior to signing, occasional typographical errors still can be missed. If any questions arise, please do not hesitate to call for verification.   electronically signed by:  Felix Pacinienee , DO  [facility] [place]

## 2018-11-24 ENCOUNTER — Ambulatory Visit (INDEPENDENT_AMBULATORY_CARE_PROVIDER_SITE_OTHER): Payer: BLUE CROSS/BLUE SHIELD | Admitting: Sports Medicine

## 2018-11-24 ENCOUNTER — Encounter: Payer: Self-pay | Admitting: Sports Medicine

## 2018-11-24 VITALS — BP 120/72 | HR 58 | Ht 71.0 in | Wt 201.6 lb

## 2018-11-24 DIAGNOSIS — M9908 Segmental and somatic dysfunction of rib cage: Secondary | ICD-10-CM | POA: Diagnosis not present

## 2018-11-24 DIAGNOSIS — M9901 Segmental and somatic dysfunction of cervical region: Secondary | ICD-10-CM | POA: Diagnosis not present

## 2018-11-24 DIAGNOSIS — M25511 Pain in right shoulder: Secondary | ICD-10-CM | POA: Diagnosis not present

## 2018-11-24 DIAGNOSIS — M9902 Segmental and somatic dysfunction of thoracic region: Secondary | ICD-10-CM

## 2018-11-24 DIAGNOSIS — M9907 Segmental and somatic dysfunction of upper extremity: Secondary | ICD-10-CM | POA: Diagnosis not present

## 2018-11-24 MED ORDER — NAPROXEN-ESOMEPRAZOLE 500-20 MG PO TBEC: 1.0000 | DELAYED_RELEASE_TABLET | Freq: Two times a day (BID) | ORAL | 0 refills | Status: AC

## 2018-11-24 MED ORDER — NAPROXEN-ESOMEPRAZOLE 500-20 MG PO TBEC: 1.0000 | DELAYED_RELEASE_TABLET | Freq: Two times a day (BID) | ORAL | 2 refills | Status: DC

## 2018-11-24 NOTE — Patient Instructions (Addendum)
Also check out [employer]"Foundation Training" which is a program developed by Dr. Myles LippsEric Goodman.   There are links to a couple of his YouTube Videos below and I would like to see you performing one of his videos 5-6 days per week.  It is best to do these exercises first thing in the morning.  They will give you a good jumpstart here today and start normalizing the way you move.  A good intro video is: "Independence from Pain 7-minute Video" - https://riley.org/https://www.youtube.com/watch?v=V179hqrkFJ0   A more advanced video is: Scientist, research (medical)"Foundation training original 12 minutes" - OilGuides.com.eehttps://www.youtube.com/watch?v=4BOTvaRaDjI  Exercises that focus more on the neck are as below: Dr. Derrill KayGoodman with [employer] [facility] [employer] teaching neck and shoulder details Part 1 - https://youtu.be/[email or web address] Part 2 Dr. Derrill KayGoodman with [facility] quick routine to practice daily - https://youtu.be/Y63sa6ETT6s  Do not try to attempt the entire video when first beginning.  Try breaking of each exercise that he goes into shorter segments.  In other words, if they perform an exercise for 45 seconds, start with 15 seconds and rest and then resume when they begin the new activity.  If you work your way up to being able to do these videos without having to stop, I expect you will see significant improvements in your pain.  If you enjoy his videos and would like to find out more you can look on his website: [email or web address].  He has a workout streaming option as well as a DVD set available for purchase.  Amazon has the best price for his DVDs.     Please perform the exercise program that we have prepared for you and gone over in detail on a daily basis.  In addition to the handout you were provided you can access your program through: [email or web address]-exercise-[email or web address]   Your unique program code is:  [email or web address]   Instructions for Duexis, Pennsaid and Vimovo:  Your prescription will be filled through a participating [facility] mail order pharmacy.  You will receive  a phone call or text from one of the participating pharmacies which can be located in any state in the [place].  You must communicate directly with them to have this medication filled.  When the pharmacy contacts you, they will need your mailing address (for shipment of the medication) andy they will need payment information if you have a copay (typically no more than $10). If you have not heard from them 2-3 days after your appointment with Dr. Berline Choughigby, contact [facility] directly at [phone].

## 2018-11-24 NOTE — Progress Notes (Signed)
PROCEDURE NOTE : OSTEOPATHIC MANIPULATION The decision today to treat with Osteopathic Manipulative Therapy (OMT) was based on physical exam findings. Verbal consent was obtained following a discussion with the patient regarding the of risks, benefits and potential side effects, including an acute pain flare,post manipulation soreness and need for repeat treatments. Additionally, we specifically discussed the minimal risk of  injury to neurovascular structures associated with Cervical manipulation.   Contraindications to OMT: NONE  Manipulation was performed as below: Regions Treated & Osteopathic Exam Findings  OA - rotated right C3 ERS LEFT (Extended, Rotated & Sidebent) C6 Extended, rotated RIGHT, sidebent RIGHT T2 - 8 Neutral, rotated LEFT, sidebent RIGHT Rib 6 Right  Posterior Right pec minor tenderpoint Right supraspinatous tenderpoint Right trapezius tenderpoint   OMT Techniques Used  . HVLA . muscle energy . myofascial release . soft tissue    The patient tolerated the treatment well and reported Improved symptoms following treatment today. Patient was given medications, exercises, stretches and lifestyle modifications per AVS and verbally.

## 2018-11-24 NOTE — Progress Notes (Signed)
PROCEDURE NOTE: THERAPEUTIC EXERCISES (97110) 15 minutes spent for Therapeutic exercises as below and as referenced in the AVS.  This included exercises focusing on stretching, strengthening, with significant focus on eccentric aspects.   Proper technique shown and discussed handout in great detail with ATC.  All questions were discussed and answered.   Long term goals include an improvement in range of motion, strength, endurance as well as avoiding reinjury. Frequency of visits is one time as determined during today's  office visit. Frequency of exercises to be performed is as per handout.  EXERCISES REVIEWED: John Henderson Exercises Intrinsic Rotator Cuff Exercises Scapular Stabilization

## 2018-11-24 NOTE — Progress Notes (Signed)
John FellsMichael D. Delorise [name], DO  [name]  John RhodyBernard Henderson - 42 y.o. male MRN [id number]  Date of birth: September 11, 1976  Visit Date: 11/24/2018  PCP: John MassedMcGowen, Philip H, MD   Referred by: John MassedMcGowen, Philip H, MD   SUBJECTIVE:  Chief Complaint  Patient presents with  . New Patient (Initial Visit)    R shoulder pain    HPI: Patient presents with 2 weeks of worsening right shoulder pain.  He reports this occurred after he was wrestling with his friend.  He is having 5-6 out of 10 aching sharp pain.  He is getting occasional popping and mechanical symptoms.  The pain does not radiate past his elbow.  It is worse with abduction and with internal rotation at 90 degrees.  Quick forceful overhead motion also exacerbates his symptoms.  Naproxen is been moderately helpful.  He is currently alternating between naproxen and ibuprofen.  REVIEW OF SYSTEMS: This does occasionally awaken him at night.  Otherwise 12 point review of systems reviewed and is negative.  He did have a right shoulder injury at age 42 that resolved on its own.  HISTORY:  Prior history reviewed and updated per electronic medical record.  Social History   Occupational History  . Not on file  Tobacco Use  . Smoking status: Never Smoker  . Smokeless tobacco: Never Used  Substance and Sexual Activity  . Alcohol use: Yes    Comment: socially  . Drug use: No  . Sexual activity: Not on file   Social History   Social History Narrative   Married, 2 sons, 1 daughter.  Two brothers: healthy.   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   College: Bachelors degree from [employer]   No tob, occ soc alcohol.  No drugs.   Hobbies: fishing, cooking, Contractorexercise/golf    Past Medical History:  Diagnosis Date  . Hay fever    No hx of asthma  . Inguinal hernia, left 06/2017   Referred to Gen Surg 06/13/17  . Recurrent sinusitis   . Right shoulder pain 11/2018   RC  tendonopathy, scapular dyskinesis (Dr. Berline Choughigby).   Past Surgical History:  Procedure Laterality Date  . ADENOIDECTOMY    . FOOT SURGERY Right    tendon re-attachment after laceration  . TONSILLECTOMY  approx 2005   family history includes Bladder Cancer in his father. Recent Labs    10/19/18 1054  CREATININE 1.03  CALCIUM 9.8  AST 30  ALT 23  TSH 1.58    OBJECTIVE:  VS:  HT:5\' 11"  (180.3 cm)   WT:201 lb 9.6 oz (91.4 kg)  BMI:28.13    BP:120/72  HR:(!) 58bpm  TEMP: ( )  RESP:95 %   PHYSICAL EXAM: Adult male.  In no acute distress.  Alert appropriate.  His bilateral upper extremities overall well aligned.  He has no acute respiratory distress.  His upper extremity strength and sensation is intact to light touch and manual muscle testing.  He has intrinsic rotator cuff strength that is intact.  There is small amount of pain with axial loading crepitation but this improves following osteopathic manipulation.  He has a negative Spurling's compression test and Lhermitte's compression test.  No significant upper extremity swelling   ASSESSMENT  1. Acute pain of right shoulder   2. Somatic dysfunction of cervical region   3. Somatic dysfunction of thoracic region   4. Somatic dysfunction of rib cage region   5. Somatic dysfunction  of upper extremity     PLAN:  Pertinent additional documentation may be included in corresponding procedure notes, imaging studies, problem based documentation and patient instructions.  Procedures:  Osteopathic manipulation was performed today based on physical exam findings.  Please see procedure note for further information including Osteopathic Exam findings Home Therapeutic exercises prescribed per procedure note.  Medications:  Meds ordered this encounter  Medications  . Naproxen-Esomeprazole (VIMOVO) 500-20 MG TBEC    Sig: Take 1 tablet by mouth 2 (two) times daily.    Dispense:  60 tablet    Refill:  2    Home Phone      [phone]  Mobile          [phone]   . Naproxen-Esomeprazole (VIMOVO) 500-20 MG TBEC    Sig: Take 1 tablet by mouth 2 (two) times daily for 1 day.    Dispense:  6 tablet    Refill:  0    Discussion/Instructions: No problem-specific Assessment & Plan notes found for this encounter.    Osteopathic manipulation was performed today based on physical exam findings.  Patient was counseled on the purpose and expected outcome of osteopathic manipulation and understands that a single treatment may not provide permanent long lasting relief.  They understand that home therapeutic exercises are critical part of the healing/treatment process and will continue with self treatment between now and their next visit as outlined.  The patient understands that the frequency of visits is meant to provide a stimulus to promote the body's own ability to heal and is not meant to be the sole means for improvement in their symptoms. Ultimately this does seem to be functional in nature exacerbated by the underlying injury 2 weeks ago.  We discussed conservative measures including osteopathic manipulation and therapeutic exercises and will plan to follow-up with him in 4 weeks to ensure clinical resolution.  If any lack of improvement further diagnostic work-up will be completed at that time.  Vimovo prescription provided.     Return in about 4 weeks (around 12/22/2018).          John MewsMichael D Netra Postlethwait, DO    [facility] Physician

## 2018-12-05 ENCOUNTER — Telehealth: Payer: Self-pay | Admitting: Family Medicine

## 2018-12-05 MED ORDER — NAPROXEN 500 MG PO TABS: 500.0000 mg | ORAL_TABLET | Freq: Two times a day (BID) | ORAL | 0 refills | Status: DC

## 2018-12-05 NOTE — Telephone Encounter (Signed)
[facility], [facility] [phone]. Advised that PA is required, they will fax info to us at [phone]. Sample at the front desk for pt to pick up.

## 2018-12-05 NOTE — Telephone Encounter (Signed)
Copied from CRM [id number]. Topic: Quick Communication - See Telephone Encounter >> Dec 05, 2018 12:58 PM Arlyss Gandyichardson, [place] Schweiss N, NT wrote: CRM for notification. See Telephone encounter for: 12/05/18. Pt states that [facility] never got in touch about the Naproxen-Esomeprazole (VIMOVO) 500-20 MG TBEC and pt is now out and would like to see if he can pick up more samples today if possible. Please advise. Dr. Berline Choughigby is the prescribe.

## 2018-12-05 NOTE — Telephone Encounter (Addendum)
PA has been initiated via [email or web address]. Called pt and advised, also advised that samples are at the front for pick up.   John Henderson (Key: [id number])    Your information has been submitted to [facility] [place]. [employer] [place] will review the request and fax you a determination directly, typically within 3 business days of your submission once all necessary information is received. If [facility] [place] has not responded in 3 business days or if you have any questions about your submission, contact [employer] [facility] [employer] [place] at [phone].

## 2018-12-05 NOTE — Telephone Encounter (Signed)
See note

## 2018-12-08 NOTE — Telephone Encounter (Signed)
[name] (Key: [id number])    This request has received a Unfavorable outcome. Please see letter faxed to your office for details on this adverse benefit determination. Please note any additional information provided by [facility] at the bottom of this request.

## 2018-12-22 ENCOUNTER — Ambulatory Visit (INDEPENDENT_AMBULATORY_CARE_PROVIDER_SITE_OTHER): Payer: BLUE CROSS/BLUE SHIELD | Admitting: Sports Medicine

## 2018-12-22 ENCOUNTER — Ambulatory Visit: Payer: Self-pay

## 2018-12-22 ENCOUNTER — Ambulatory Visit (INDEPENDENT_AMBULATORY_CARE_PROVIDER_SITE_OTHER): Payer: BLUE CROSS/BLUE SHIELD

## 2018-12-22 ENCOUNTER — Encounter: Payer: Self-pay | Admitting: Sports Medicine

## 2018-12-22 VITALS — BP 110/70 | HR 77 | Ht 71.0 in | Wt 205.0 lb

## 2018-12-22 DIAGNOSIS — M25511 Pain in right shoulder: Secondary | ICD-10-CM | POA: Diagnosis not present

## 2018-12-22 DIAGNOSIS — M75101 Unspecified rotator cuff tear or rupture of right shoulder, not specified as traumatic: Secondary | ICD-10-CM | POA: Diagnosis not present

## 2018-12-22 DIAGNOSIS — G2589 Other specified extrapyramidal and movement disorders: Secondary | ICD-10-CM

## 2018-12-22 MED ORDER — NITROGLYCERIN 0.2 MG/HR TD PT24: MEDICATED_PATCH | TRANSDERMAL | 1 refills | Status: DC

## 2018-12-22 NOTE — Patient Instructions (Signed)

## 2018-12-22 NOTE — Progress Notes (Signed)
John Henderson. Delorise Shiner Sports Medicine [facility] at [facility] [phone]  John Henderson - 43 y.o. male MRN [id number]  Date of birth: 06/21/1976  Visit Date: December 22, 2018  PCP: Jeoffrey Massed, MD   Referred by: Jeoffrey Massed, MD  SUBJECTIVE:  Chief Complaint  Patient presents with  . Follow-up    R shoulder pain.  Given scap stab and RC HEP and [employer] at last visit.  Vimovo.    HPI: He reports being 65% better than at last visit.  He has had some issues with getting the Vimovo and has been taking this only consistently for several weeks.  He had had moderate amount of improvement to the point that he is not having any issues at night but did have difficulty last night.  He has been performing home therapeutic exercises with a moderate degree of pain that he is pushing through.  He feels this may be slowing some of his progress.  He does report a remote history of a right shoulder injury when he was playing football as a child.  REVIEW OF SYSTEMS: Per HPI  HISTORY:  Prior history reviewed and updated per electronic medical record.  Social History   Occupational History  . Not on file  Tobacco Use  . Smoking status: Never Smoker  . Smokeless tobacco: Never Used  Substance and Sexual Activity  . Alcohol use: Yes    Comment: socially  . Drug use: No  . Sexual activity: Not on file   Social History   Social History Narrative   Married, 2 sons, 1 daughter.  Two brothers: healthy.   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   College: Bachelors degree from [place].   No tob, occ soc alcohol.  No drugs.   Hobbies: fishing, cooking, exercise/golf    OBJECTIVE:  VS:  HT:5\' 11"  (180.3 cm)   WT:205 lb (93 kg)  BMI:28.6    BP:110/70  HR:77bpm  TEMP: ( )  RESP:95 %   PHYSICAL EXAM: Adult male.  No acute distress.  Alert and appropriate.  Right shoulder with full overhead range of motion with good internal and  external rotation range of motion and strength.  He has moderate degree of pain and weakness with empty can testing on the right.  Speeds testing and O'Brien's testing are normal.  Negative Hawkins and Neer's.   ASSESSMENT   1. Acute pain of right shoulder   2. Rotator cuff syndrome of right shoulder   3. Scapular dyskinesis       PROCEDURES:  None  PLAN:  Pertinent additional documentation may be included in corresponding procedure notes, imaging studies, problem based documentation and patient instructions.  Symptoms are concerning for subacromial bursitis with a small cuff supraspinatus tendinopathy.  He does have a osseous irregularity on the ultrasound did not confirm that there is no significant interosseous issues that contributing to his pain.  He should continue with the home therapeutic exercises previously reviewed and the correct form and posture were reviewed with our athletic trainer today.    Activity modifications and the importance of avoiding exacerbating activities (limiting pain to no more than a 4 / 10 during or following activity) recommended and discussed. Discussed red flag symptoms that warrant earlier emergent evaluation and patient voices understanding.  Meds ordered this encounter  Medications  . nitroGLYCERIN (NITRODUR - DOSED IN MG/24 HR) 0.2 mg/hr patch    Sig: Place 1/4 to 1/2 of a  patch over affected region. Remove and replace once daily.  Slightly alter skin placement daily    Dispense:  30 patch    Refill:  1    For musculoskeletal purposes.  Okay to cut patch.    Lab Orders  No laboratory test(s) ordered today   Imaging Orders  US MSK POCT ULTRASOUND  DG Shoulder Right  Referral Orders  No referral(s) requested today    At follow up will plan : repeat MSK Ultrasound Return in about 4 weeks (around 01/19/2019).          Andrena MewsMichael D Rigby, DO     Sports Medicine Physician

## 2018-12-22 NOTE — Procedures (Signed)
LIMITED MSK ULTRASOUND OF Right shoulder Images were obtained and interpreted by myself, Gaspar Bidding, DO  Images have been saved and stored to PACS system. Images obtained on: GE S7 Ultrasound machine  FINDINGS:   Right shoulder biceps tendon is intact.  He has slight thickening subscapularis which is minimal.  Supraspinatus has thickened especially compared to the contralateral side with a very small subacromial bursa.    There is an indentation of the humeral head at the supraspinatus insertion more on the anterior aspect.  There is no increased neovascularity in this region.  Infraspinatus and teres minor appear normal.  IMPRESSION:  1. Supraspinatus tendinopathy with a small subacromial bursa.

## 2019-01-13 ENCOUNTER — Encounter: Payer: Self-pay | Admitting: Sports Medicine

## 2019-01-16 ENCOUNTER — Encounter: Payer: Self-pay | Admitting: Sports Medicine

## 2019-01-16 ENCOUNTER — Ambulatory Visit (INDEPENDENT_AMBULATORY_CARE_PROVIDER_SITE_OTHER): Payer: BLUE CROSS/BLUE SHIELD | Admitting: Sports Medicine

## 2019-01-16 VITALS — BP 118/78 | HR 58 | Ht 71.0 in | Wt 206.2 lb

## 2019-01-16 DIAGNOSIS — M75101 Unspecified rotator cuff tear or rupture of right shoulder, not specified as traumatic: Secondary | ICD-10-CM | POA: Diagnosis not present

## 2019-01-16 DIAGNOSIS — G2589 Other specified extrapyramidal and movement disorders: Secondary | ICD-10-CM

## 2019-01-16 DIAGNOSIS — M7521 Bicipital tendinitis, right shoulder: Secondary | ICD-10-CM

## 2019-01-16 DIAGNOSIS — M9901 Segmental and somatic dysfunction of cervical region: Secondary | ICD-10-CM

## 2019-01-16 DIAGNOSIS — M9908 Segmental and somatic dysfunction of rib cage: Secondary | ICD-10-CM

## 2019-01-16 DIAGNOSIS — M9902 Segmental and somatic dysfunction of thoracic region: Secondary | ICD-10-CM | POA: Diagnosis not present

## 2019-01-16 NOTE — Progress Notes (Signed)
John Henderson. John Henderson Sports Medicine [facility] at [facility] [phone]  John Henderson - 43 y.o. male MRN [id number]  Date of birth: 24-May-1976  Visit Date: January 16, 2019  PCP: Jeoffrey Massed, MD   Referred by: Jeoffrey Massed, MD  SUBJECTIVE:  Chief Complaint  Patient presents with  . Right Shoulder - Follow-up    R shoulder XR- 12/22/18.  Nitro Protocol, causes HA.  HEP (RC and scap stab) and Derrill Kay. Occasional mechanical sx. Takes IBU prn.   . Follow-up    R shoulder    HPI: Patient is here for follow-up of right shoulder and periscapular pain.  He has had good improvement with no significant pain day-to-day but does have mild pain especially with terminal motion such as late phase cocking with throwing overhead football and passing basketball.  Pain does seem to be localized to the posterior capsule.  Reaching back behind him does lead to some pinching discomfort.  He is intermittently taking the nitroglycerin.  He does get a throbbing headache with this however.  Is continuing to perform the home therapeutic exercises on regular basis and has modified his lifting activity has questions about how to return.  REVIEW OF SYSTEMS: No significant nighttime awakenings due to this issue. Denies fevers, chills, recent weight gain or weight loss.  No night sweats.   HISTORY:  Prior history reviewed and updated per electronic medical record.  Patient Active Problem List   Diagnosis Date Noted  . Tinea cruris 11/11/2018  . Biceps tendinitis of right upper extremity 11/11/2018    R shoulder XR - 12/22/18   . Epididymitis 09/27/2015  . Health maintenance examination 01/10/2014   Social History   Occupational History  . Not on file  Tobacco Use  . Smoking status: Never Smoker  . Smokeless tobacco: Never Used  Substance and Sexual Activity  . Alcohol use: Yes    Comment: socially  . Drug use: No  . Sexual activity: Not on file   Social  History   Social History Narrative   Married, 2 sons, 1 daughter.  Two brothers: healthy.   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   College: Bachelors degree from [place].   No tob, occ soc alcohol.  No drugs.   Hobbies: fishing, cooking, Contractor    OBJECTIVE:  VS:  HT:5\' 11"  (180.3 cm)   WT:206 lb 3.2 oz (93.5 kg)  BMI:28.77    BP:118/78  HR:(!) 58bpm  TEMP: ( )  RESP:97 %   PHYSICAL EXAM: Adult male. No acute distress.  Alert and appropriate. Right Shoulder Exam Well aligned, no significant deformity. No overlying skin changes. Neck: normal range of motion and supple Non tender to palpation over: Bony Landmarks TTP over: Periscapular region Axial loading produces: No pain and No crepitation Drop arm test: negative  Internal Rotation: ROM: Normal with no pain.   Strength: Normal External Rotation: ROM: Normal with no pain.   Strength: Normal  Hawkins: normal, no pain Neers: normal, no pain  Empty Can: normal, no pain Strength: Normal Speed's: normal, no pain Strength: Normal O'Briens: normal, no pain Strength: Normal   ASSESSMENT:  1. Rotator cuff syndrome of right shoulder   2. Biceps tendinitis of right upper extremity   3. Somatic dysfunction of cervical region   4. Somatic dysfunction of thoracic region   5. Somatic dysfunction of rib cage region   6. Scapular dyskinesis     PROCEDURES:  PROCEDURE NOTE: OSTEOPATHIC  MANIPULATION   The decision today to treat with Osteopathic Manipulative Therapy (OMT) was based on physical exam findings. Verbal consent was obtained following a discussion with the patient regarding the of risks, benefits and potential side effects, including an acute pain flare,post manipulation soreness and need for repeat treatments.  NONE  Manipulation was performed as below:  [facility] Treated & Osteopathic Exam Findings CERVICAL SPINE: OA - rotated right C5 Extended, rotated RIGHT, sidebent RIGHT THORACIC SPINE:   T2  - 6 Neutral, rotated RIGHT, sidebent LEFT RIBS:   Rib 1Right  Inhalation dysfunction (depressed/exhaled)    OMT Techniques Used:  HVLA muscle energy myofascial release    The patient tolerated the treatment well and reported Improved symptoms following treatment today. Patient was given medications, exercises, stretches and lifestyle modifications per AVS and verbally.       PLAN:  Pertinent additional documentation may be included in corresponding procedure notes, imaging studies, problem based documentation and patient instructions.  He is overall doing significantly better.  We spent extensive time discussing return to activity and avoiding end range motions.  He will return to therapeutic exercises as previously had as well as his weightlifting regimen performed previously with these limitations in place.  Okay to discontinue the nitroglycerin protocol given headaches.  Continue previously prescribed home exercise program.  Can consider a referral to physical therapy if persistent or worsening symptoms.  He does have tightness within muscles and will begin working with his cervical traction unit at home.  Osteopathic manipulation was performed today based on physical exam findings.  Patient has responded well to osteopathic manipulation previously the prior manipulation did not provide permanent long lasting relief.  The patient does feel as though there was significant benefit to the prior manipulation and they wished for repeat manipulation today.  They understand that home therapeutic exercises are critical part of the healing/treatment process and will continue with self treatment between now and their next visit as outlined.  The patient understands that the frequency of visits is meant to provide a stimulus to promote the body's own ability to heal and is not meant to be the sole means for improvement in their symptoms.  Activity modifications and the importance of avoiding  exacerbating activities (limiting pain to no more than a 4 / 10 during or following activity) recommended and discussed.   Discussed red flag symptoms that warrant earlier emergent evaluation and patient voices understanding.    No orders of the defined types were placed in this encounter.  Lab Orders  No laboratory test(s) ordered today   Imaging Orders  No imaging studies ordered today   Referral Orders  No referral(s) requested today    At follow up will plan to consider: repeat osteopathic manipulation  Return if symptoms worsen or fail to improve.          Andrena Mews, DO    [place] Sports Medicine Physician

## 2019-04-03 IMAGING — DX DG SHOULDER 2+V*R*
3 series · 3 of 3 positions shown · non-contrast
Comparison: None.

CLINICAL DATA: Right shoulder pain

EXAM:
RIGHT SHOULDER - 2+ VIEW

[shoulder grashey ap]
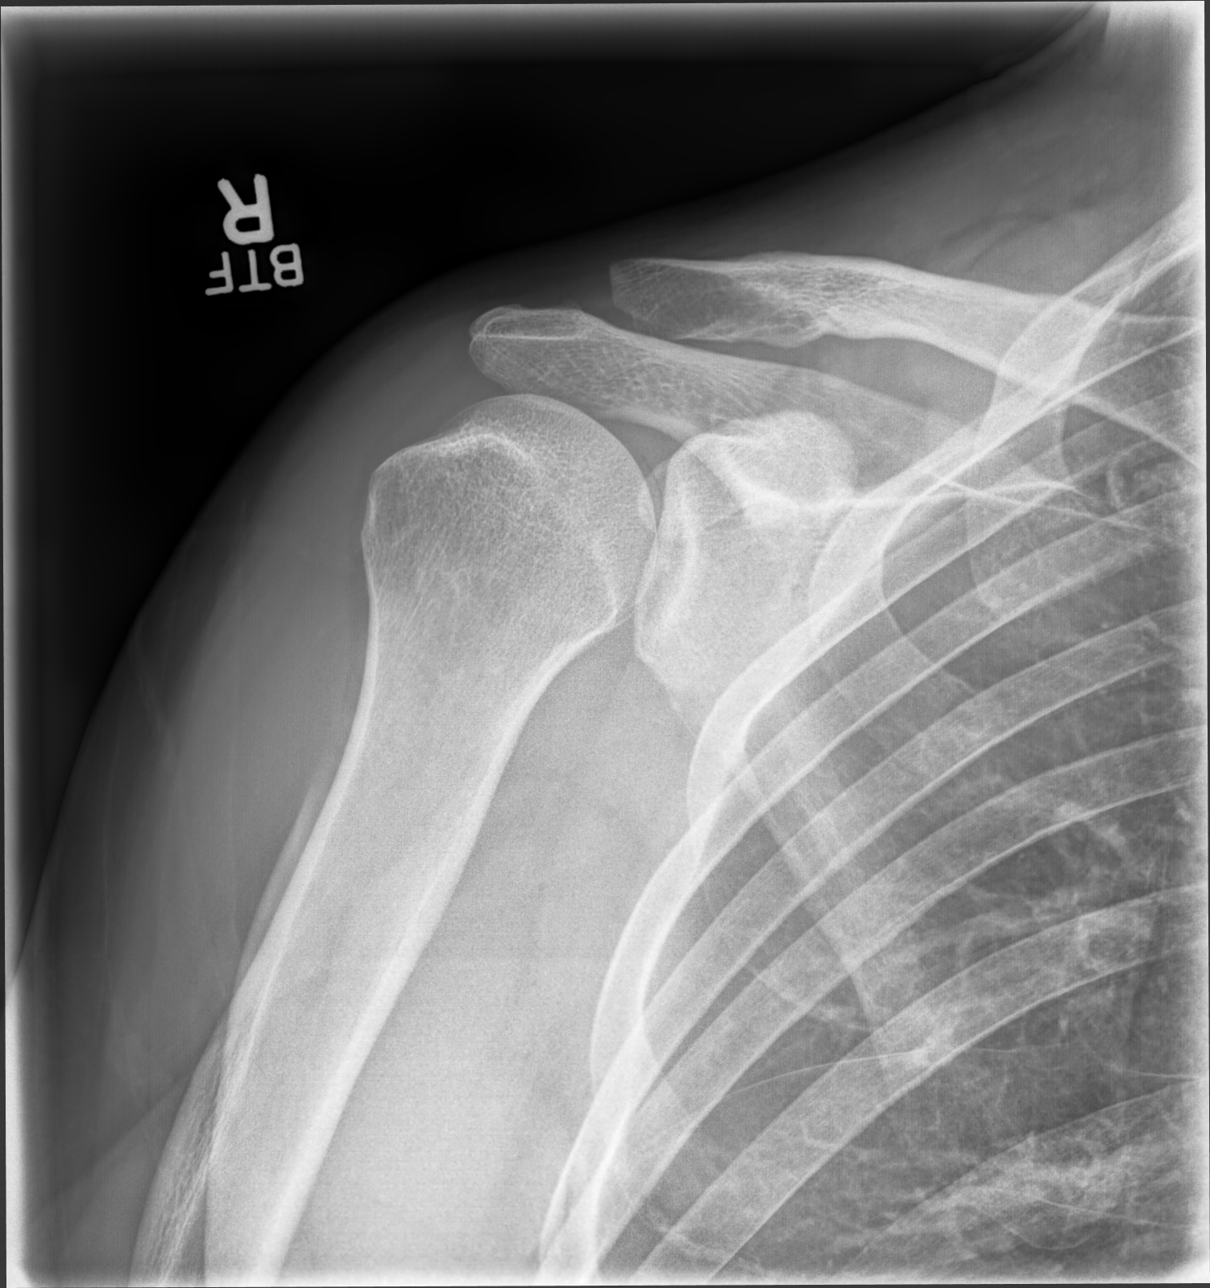

[shoulder y view]
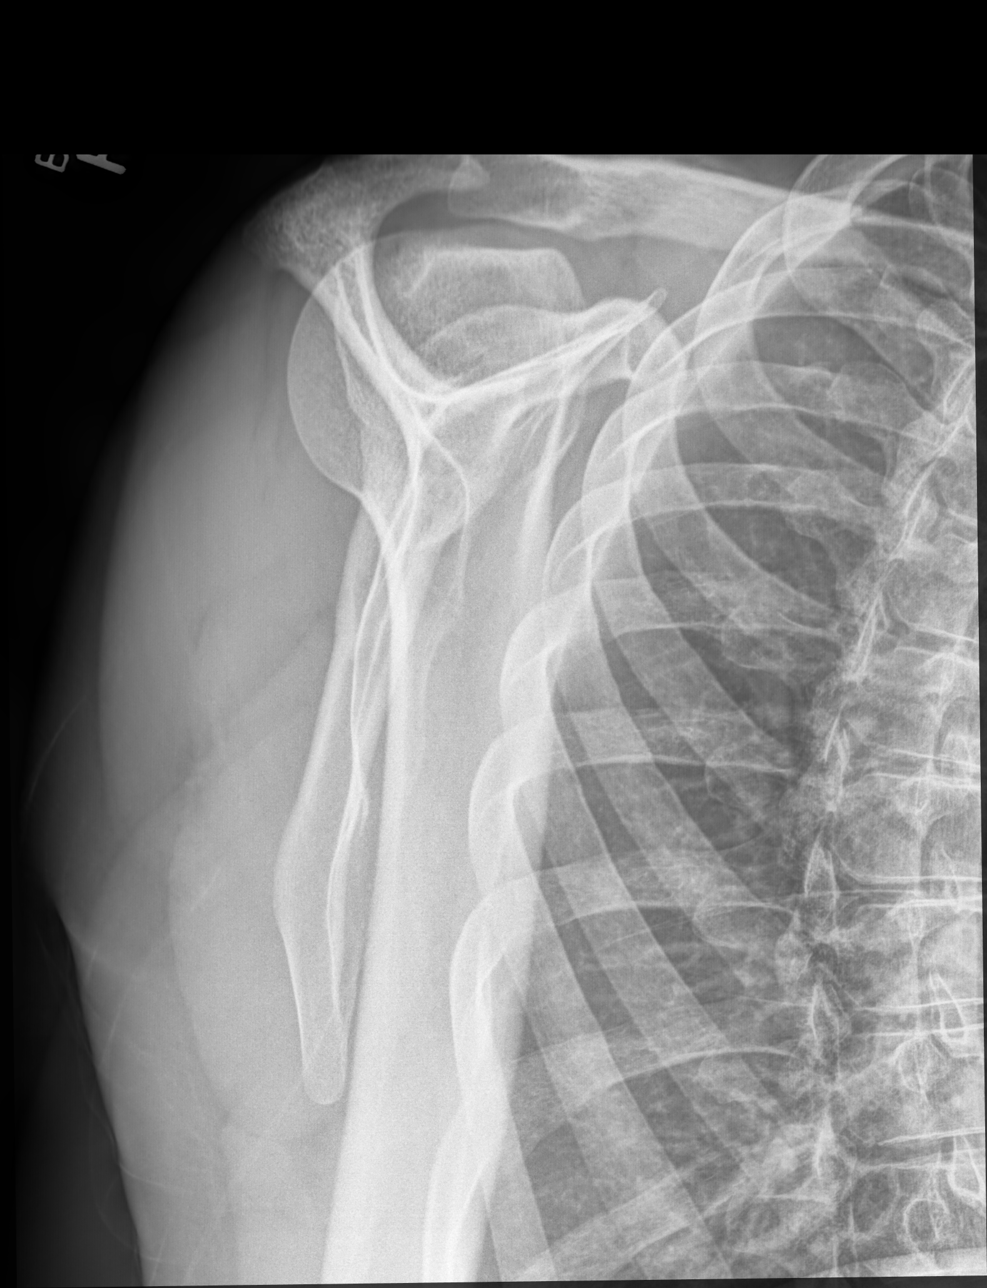

[shoulder axial]
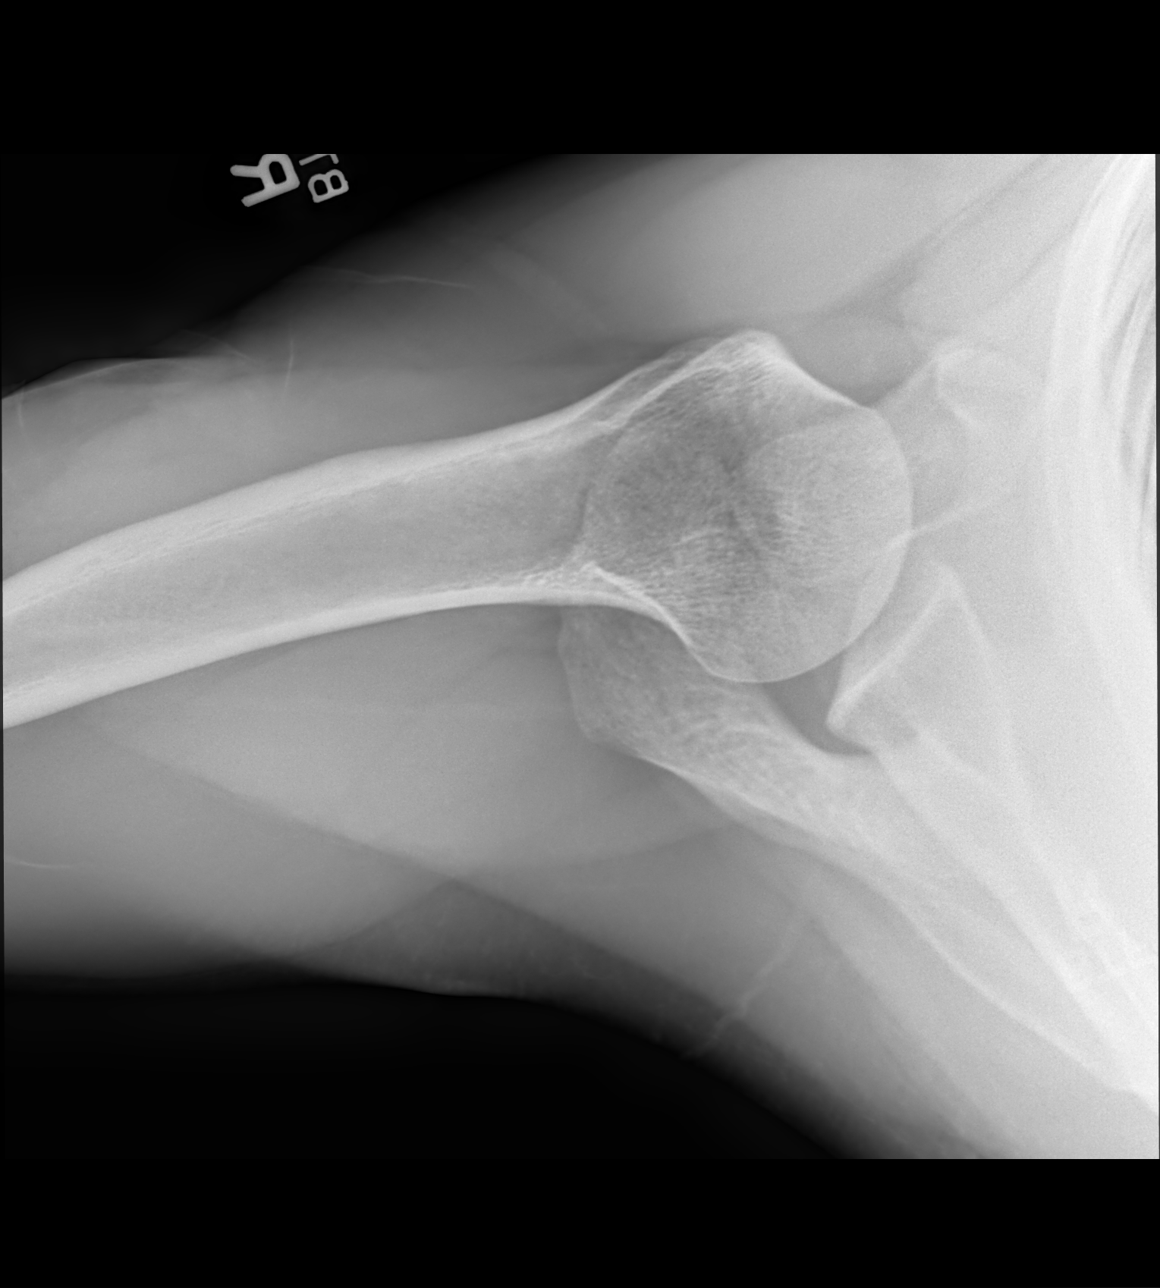

[3 of 3 positions shown; findings below may reference images not displayed]

FINDINGS: There is no evidence of fracture or dislocation. There is no
evidence of arthropathy or other focal bone abnormality. Soft
tissues are unremarkable.
IMPRESSION: Negative.

## 2019-05-04 ENCOUNTER — Other Ambulatory Visit: Payer: Self-pay

## 2019-05-04 ENCOUNTER — Encounter: Payer: Self-pay | Admitting: Family Medicine

## 2019-05-04 ENCOUNTER — Ambulatory Visit (INDEPENDENT_AMBULATORY_CARE_PROVIDER_SITE_OTHER): Payer: BLUE CROSS/BLUE SHIELD | Admitting: Family Medicine

## 2019-05-04 VITALS — Temp 98.7°F | Ht 71.0 in | Wt 199.0 lb

## 2019-05-04 DIAGNOSIS — R42 Dizziness and giddiness: Secondary | ICD-10-CM

## 2019-05-04 DIAGNOSIS — H9313 Tinnitus, bilateral: Secondary | ICD-10-CM

## 2019-05-04 MED ORDER — FLUTICASONE PROPIONATE 50 MCG/ACT NA SUSP: 2.0000 | Freq: Every day | NASAL | 6 refills | Status: DC

## 2019-05-04 MED ORDER — PREDNISONE 20 MG PO TABS: ORAL_TABLET | ORAL | 0 refills | Status: DC

## 2019-05-04 NOTE — Progress Notes (Signed)
VIRTUAL VISIT VIA VIDEO  I connected with John Henderson on 05/04/19 at 10:20 AM EDT by a video enabled telemedicine application and verified that I am speaking with the correct person using two identifiers. Location patient: Home Location provider: [facility] Persons participating in the virtual visit: Patient, Dr. Claiborne BillingsKuneff and R.Baker, LPN  I discussed the limitations of evaluation and management by telemedicine and the availability of in person appointments. The patient expressed understanding and agreed to proceed.   SUBJECTIVE Chief Complaint  Patient presents with   Tinnitus    x30-40 days. Ringing is constant.    Dizziness    Dizziness started yesterday that comes and goes. 6 months ago pt had issue with vertigo that was much worse and lasted only 3-4 days.    Patient Care Team    Relationship Specialty Notifications Start End  McGowen, Maryjean MornPhilip H, MD PCP - General Family Medicine  01/10/14   Cherlyn RobertsLupton, Frederick, MD Consulting Physician Dermatology  11/11/15   Andrena Mewsigby, Michael D, DO Consulting Physician Sports Medicine  12/22/18      HPI: John Henderson is a 43 y.o. male present for ringing in his ears and dizziness.  Patient reports the ringing in his ears have been present for a little over a month.  He reports ringing in bilateral ears but possibly little worse on the right side.  States it is a high-pitched sound, but not very loud.  If he is busy his mind is taken off of it he can hear it.  It is only when he was relaxing in the quiet he can hear it.  Is not routinely pulsatile.  Denies any hearing loss.  He denies any NSAID use.  He denies any recent illness.  He endorses new associated "dizziness "that started last night.  He reports that the sensation of movement that lasts 3 to 4 seconds but was happening multiple times over the course of 3 hours yesterday evening and has continued this morning x4.  It is not associated with any particular head movement or  direction.  He reports it almost feels like a movement in his eyes.  He had an episode of vertigo that he states was pretty intense approximately 6 months ago, also lasted 3 to 4 days and resolved completely on its own.  He denies fever, chills, nausea or vomit.  He states he had a mild strain headache yesterday but it self resolved.  ROS: See pertinent positives and negatives per HPI.  Patient Active Problem List   Diagnosis Date Noted   Tinea cruris 11/11/2018   Biceps tendinitis of right upper extremity 11/11/2018   Epididymitis 09/27/2015   Health maintenance examination 01/10/2014    Social History   Tobacco Use   Smoking status: Never Smoker   Smokeless tobacco: Never Used  Substance Use Topics   Alcohol use: Yes    Comment: socially    Current Outpatient Medications:    ciclopirox (LOPROX) 0.77 % cream, Apply topically 2 (two) times daily. (Patient not taking: Reported on 05/04/2019), Disp: 30 g, Rfl: 0   naproxen (NAPROSYN) 500 MG tablet, Take 1 tablet (500 mg total) by mouth 2 (two) times daily with a meal. (Patient not taking: Reported on 05/04/2019), Disp: 12 tablet, Rfl: 0   nitroGLYCERIN (NITRODUR - DOSED IN MG/24 HR) 0.2 mg/hr patch, Place 1/4 to 1/2 of a patch over affected region. Remove and replace once daily.  Slightly alter skin placement daily (Patient not taking: Reported on 05/04/2019), Disp:  30 patch, Rfl: 1  No Known Allergies  OBJECTIVE: Temp 98.7 F (37.1 C) (Oral)    Ht 5\' 11"  (1.803 m)    Wt 199 lb (90.3 kg)    BMI 27.75 kg/m  Gen: No acute distress. Nontoxic in appearance. Appears well on exam.  HENT: AT. [place].  MMM.  Eyes:Pupils Equal Round Reactive to light, Extraocular movements intact,  Conjunctiva without redness, discharge or icterus. Chest: Cough or shortness of breath not present Skin: No rashes, purpura or petechiae.  Neuro:  Normal gait. Alert. Oriented x3  Psych: Normal affect, dress and demeanor. Normal speech. Normal thought content  and judgment.  ASSESSMENT AND PLAN: John Henderson is a 43 y.o. male present for  Tinnitus of both ears/Vertigo Uncertain etiology of his symptoms.  He reports the ringing in the ears has been present for at least 4 weeks and this is his second experience with vertigo, the first 6 months ago. Discussed the importance of hydration, which can cause vertigo to worsen. Will attempt tx as possible labyrinthitis/inner ear cause-he has had recurrent sinusitis.Marland Kitchen start Flonase and prednisone prescribed.  Avoid NSAIDs. Patient was strongly encouraged to follow-up in 2 weeks if symptoms are not improving-sooner if worsening.  At that time would likely need to be in an office visit to have a hearing exam, evaluate for nystagmus and a physical exam to start evaluation for tinnitus and dizziness. F/U 2 weeks with PCP if not improved, sooner if worsening.    > 25 minutes spent with patient, >50% of time spent face to face counseling     Felix Pacini, DO 05/04/2019

## 2019-05-04 NOTE — Patient Instructions (Signed)
Tinnitus Tinnitus refers to hearing a sound when there is no actual source for that sound. This is often described as ringing in the ears. However, people with this condition may hear a variety of noises, in one ear or in both ears. The sounds of tinnitus can be soft, loud, or somewhere in between. Tinnitus can last for a few seconds or can be constant for days. It may go away without treatment and come back at various times. When tinnitus is constant or happens often, it can lead to other problems, such as trouble sleeping and trouble concentrating. Almost everyone experiences tinnitus at some point. Tinnitus that is long-lasting (chronic) or comes back often (recurs) may require medical attention. What are the causes? The cause of tinnitus is often not known. In some cases, it can result from other problems or conditions, including:  Exposure to loud noises from machinery, music, or other sources.  Hearing loss.  Ear or sinus infections.  Earwax buildup.  An object (foreign body) stuck in the ear.  Taking certain medicines.  Drinking alcohol or caffeine.  High blood pressure.  Heart diseases.  Anemia.  Allergies.  Meniere's disease.  Thyroid problems.  Tumors.  A weak, bulging blood vessel (aneurysm) near the ear.  Depression or other mood disorders. What are the signs or symptoms? The main symptom of tinnitus is hearing a sound when there is no source for that sound. It may sound like:  Buzzing.  Roaring.  Ringing.  Blowing air, like the sound heard when you listen to a seashell.  Hissing.  Whistling.  Sizzling.  Humming.  Running water.  A musical note.  Tapping. Symptoms may affect only one ear (unilateral) or both ears (bilateral). How is this diagnosed? Tinnitus is diagnosed based on your symptoms, your medical history, and a physical exam. Your health care provider may do a thorough hearing test (audiologic exam) if your tinnitus:  Is  unilateral.  Causes hearing difficulties.  Lasts 6 months or longer. You may work with a health care provider who specializes in hearing disorders (audiologist). You may be asked questions about your symptoms and how they affect your daily life. You may have other tests done, such as:  CT scan.  MRI.  An imaging test of how blood flows through your blood vessels (angiogram). How is this treated? Treating an underlying medical condition can sometimes make tinnitus go away. If your tinnitus continues, other treatments may include:  Medicines, such as antidepressants or sleeping aids.  Sound generators to mask the tinnitus. These include: ? Tabletop sound machines that play relaxing sounds to help you fall asleep. ? Wearable devices that fit in your ear and play sounds or music. ? Acoustic neural stimulation. This involves using headphones to listen to music that contains an auditory signal. Over time, listening to this signal may change some pathways in your brain and make you less sensitive to tinnitus. This treatment is used for very severe cases when no other treatment is working.  Therapy and counseling to help you manage the stress of living with tinnitus.  Using hearing aids or cochlear implants if your tinnitus is related to hearing loss. Hearing aids are worn in the outer ear. Cochlear implants are surgically placed in the inner ear. Follow these instructions at home: Managing symptoms      When possible, avoid being in loud places and being exposed to loud sounds.  Wear hearing protection, such as earplugs, when you are exposed to loud noises.  Use   a white noise machine, a humidifier, or other devices to mask the sound of tinnitus.  Practice techniques for reducing stress, such as meditation, yoga, or deep breathing. Work with your health care provider if you need help with managing stress.  Sleep with your head slightly raised. This may reduce the impact of tinnitus.  General instructions  Do not use stimulants, such as nicotine, alcohol, or caffeine. Talk with your health care provider about other stimulants to avoid. Stimulants are substances that can make you feel alert and attentive by increasing certain activities in the body (such as heart rate and blood pressure). These substances may make tinnitus worse.  Take over-the-counter and prescription medicines only as told by your health care provider.  Try to get plenty of sleep each night.  Keep all follow-up visits as told by your health care provider. This is important. Contact a health care provider if:  Your tinnitus continues for 3 weeks or longer without stopping.  Your symptoms get worse or do not get better with home care.  You develop tinnitus after a head injury.  You have tinnitus along with any of the following: ? Dizziness. ? Loss of balance. ? Nausea and vomiting. Summary  Tinnitus refers to hearing a sound when there is no actual source for that sound. This is often described as ringing in the ears.  Symptoms may affect only one ear (unilateral) or both ears (bilateral).  Use a white noise machine, a humidifier, or other devices to mask the sound of tinnitus.  Do not use stimulants, such as nicotine, alcohol, or caffeine. Talk with your health care provider about other stimulants to avoid. These substances may make tinnitus worse. This information is not intended to replace advice given to you by your health care provider. Make sure you discuss any questions you have with your health care provider. Document Released: 11/23/2005 Document Revised: 09/02/2017 Document Reviewed: 09/02/2017 Elsevier Interactive Patient Education  2019 Elsevier Inc.   Vertigo  Vertigo means that you feel like you are moving when you are not. Vertigo can also make you feel like things around you are moving when they are not. This feeling can come and go at any time. Vertigo often goes away on its  own. Follow these instructions at home:  Avoid making fast movements.  Avoid driving.  Avoid using heavy machinery.  Avoid doing any task or activity that might cause danger to you or other people if you would have a vertigo attack while you are doing it.  Sit down right away if you feel dizzy or have trouble with your balance.  Take over-the-counter and prescription medicines only as told by your doctor.  Follow instructions from your doctor about which positions or movements you should avoid.  Drink enough fluid to keep your pee (urine) clear or pale yellow.  Keep all follow-up visits as told by your doctor. This is important. Contact a doctor if:  Medicine does not help your vertigo.  You have a fever.  Your problems get worse or you have new symptoms.  Your family or friends see changes in your behavior.  You feel sick to your stomach (nauseous) or you throw up (vomit).  You have a "pins and needles" feeling or you are numb in part of your body. Get help right away if:  You have trouble moving or talking.  You are always dizzy.  You pass out (faint).  You get very bad headaches.  You feel weak or have trouble  using your hands, arms, or legs.  You have changes in your hearing.  You have changes in your seeing (vision).  You get a stiff neck.  Bright light starts to bother you. This information is not intended to replace advice given to you by your health care provider. Make sure you discuss any questions you have with your health care provider. Document Released: 09/01/2008 Document Revised: 04/30/2016 Document Reviewed: 03/18/2015 Elsevier Interactive Patient Education  [employer].

## 2019-05-11 ENCOUNTER — Encounter: Payer: Self-pay | Admitting: Family Medicine

## 2019-07-25 ENCOUNTER — Ambulatory Visit (INDEPENDENT_AMBULATORY_CARE_PROVIDER_SITE_OTHER): Payer: 59 | Admitting: Family Medicine

## 2019-07-25 ENCOUNTER — Encounter: Payer: Self-pay | Admitting: Family Medicine

## 2019-07-25 ENCOUNTER — Other Ambulatory Visit: Payer: Self-pay

## 2019-07-25 VITALS — Wt 202.0 lb

## 2019-07-25 DIAGNOSIS — B360 Pityriasis versicolor: Secondary | ICD-10-CM | POA: Diagnosis not present

## 2019-07-25 NOTE — Progress Notes (Signed)
Virtual Visit via Video Note  I connected with Bernie on 07/25/19 at 11:30 AM EDT by a video enabled telemedicine application and verified that I am speaking with the correct person using two identifiers.  Location patient: home Location provider:work or home office Persons participating in the virtual visit: patient, provider  I discussed the limitations of evaluation and management by telemedicine and the availability of in person appointments. The patient expressed understanding and agreed to proceed.  Telemedicine visit is a necessity given the COVID-19 restrictions in place at the current time.  HPI: 43 y/o WM being seen today for itchy skin on neck. Last several weeks has noted lighter pigmented areas in splotches on back of neck.  Mildly itchy. Not applying any otc cream.  Hx of jock itch that responded to ciclopirox cream in the past. Otherwise is feeling well.  ROS: See pertinent positives and negatives per HPI.  Past Medical History:  Diagnosis Date  . Hay fever    No hx of asthma  . Inguinal hernia, left 06/2017   Referred to Gen Surg 06/13/17  . Recurrent sinusitis   . Right shoulder pain 11/2018   RC tendonopathy, scapular dyskinesis (Dr. Berline Choughigby).  . Tinnitus aurium, bilateral   . Vertigo     Past Surgical History:  Procedure Laterality Date  . ADENOIDECTOMY    . FOOT SURGERY Right    tendon re-attachment after laceration  . TONSILLECTOMY  approx 2005    Family History  Problem Relation Age of Onset  . Bladder Cancer Father     SOCIAL HX:  Social History   Socioeconomic History  . Marital status: Married    Spouse name: Not on file  . Number of children: Not on file  . Years of education: Not on file  . Highest education level: Not on file  Occupational History  . Not on file  Social Needs  . Financial resource strain: Not on file  . Food insecurity    Worry: Not on file    Inability: Not on file  . Transportation needs    Medical: Not on file     Non-medical: Not on file  Tobacco Use  . Smoking status: Never Smoker  . Smokeless tobacco: Never Used  Substance and Sexual Activity  . Alcohol use: Yes    Comment: socially  . Drug use: No  . Sexual activity: Not on file  Lifestyle  . Physical activity    Days per week: Not on file    Minutes per session: Not on file  . Stress: Not on file  Relationships  . Social Musicianconnections    Talks on phone: Not on file    Gets together: Not on file    Attends religious service: Not on file    Active member of club or organization: Not on file    Attends meetings of clubs or organizations: Not on file    Relationship status: Not on file  Other Topics Concern  . Not on file  Social History Narrative   Married, 2 sons, 1 daughter.  Two brothers: healthy.   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   College: Bachelors degree from [employer]   No tob, occ soc alcohol.  No drugs.   Hobbies: fishing, cooking, exercise/golf     MEDS: none currently  EXAM:  VITALS per patient if applicable: Wt 202 lb (91.6 kg)   BMI 28.17 kg/m    GENERAL: alert, oriented, appears well and in no acute distress  HEENT: atraumatic, conjunttiva clear, no obvious abnormalities on inspection of external nose and ears  NECK: normal movements of the head and neck I can see a couple of indistinct hypopigmented patches of skin on back of his neck.  LUNGS: on inspection no signs of respiratory distress, breathing rate appears normal, no obvious gross SOB, gasping or wheezing  CV: no obvious cyanosis  MS: moves all visible extremities without noticeable abnormality  PSYCH/NEURO: pleasant and cooperative, no obvious depression or anxiety, speech and thought processing grossly intact  LABS: none today    Chemistry      Component Value Date/Time   NA 139 10/19/2018 1054   K 4.5 10/19/2018 1054   CL 102 10/19/2018 1054   CO2 31 10/19/2018 1054   BUN 15 10/19/2018 1054   CREATININE 1.03  10/19/2018 1054      Component Value Date/Time   CALCIUM 9.8 10/19/2018 1054   ALKPHOS 45 10/19/2018 1054   AST 30 10/19/2018 1054   ALT 23 10/19/2018 1054   BILITOT 0.8 10/19/2018 1054      ASSESSMENT AND PLAN:  Discussed the following assessment and plan:  Tinea versicolor: apply ciclopirox cream if he can find any leftover from past rx given by Dr. Raoul Pitch when he had tinea cruris.  If not, I recommended he get otc generic lamisil cream and use bid and give it at least 3 weeks of treatment.  I discussed the assessment and treatment plan with the patient. The patient was provided an opportunity to ask questions and all were answered. The patient agreed with the plan and demonstrated an understanding of the instructions.   The patient was advised to call back or seek an in-person evaluation if the symptoms worsen or if the condition fails to improve as anticipated.  F/u: if not improving appropriately  Signed:  Crissie Sickles, MD           07/25/2019

## 2019-08-08 DIAGNOSIS — U071 COVID-19: Secondary | ICD-10-CM

## 2019-08-08 HISTORY — DX: COVID-19: U07.1

## 2019-08-15 ENCOUNTER — Other Ambulatory Visit: Payer: Self-pay | Admitting: *Deleted

## 2019-08-15 DIAGNOSIS — Z20822 Contact with and (suspected) exposure to covid-19: Secondary | ICD-10-CM

## 2019-08-17 ENCOUNTER — Encounter: Payer: Self-pay | Admitting: Internal Medicine

## 2019-08-17 ENCOUNTER — Other Ambulatory Visit: Payer: Self-pay

## 2019-08-17 ENCOUNTER — Ambulatory Visit (INDEPENDENT_AMBULATORY_CARE_PROVIDER_SITE_OTHER): Payer: 59 | Admitting: Internal Medicine

## 2019-08-17 DIAGNOSIS — U071 COVID-19: Secondary | ICD-10-CM

## 2019-08-17 LAB — NOVEL CORONAVIRUS, NAA: SARS-CoV-2, NAA: DETECTED — AB

## 2019-08-17 MED ORDER — AZITHROMYCIN 250 MG PO TABS: ORAL_TABLET | ORAL | 0 refills | Status: DC

## 2019-08-17 NOTE — Progress Notes (Signed)
Subjective:    Patient ID: John Henderson, male    DOB: [birth year], 43 y.o.   MRN: [id number]  DOS:  08/17/2019 Type of visit - description:  Virtual Visit via Video Note  I connected with@   by a video enabled telemedicine application and verified that I am speaking with the correct person using two identifiers.   THIS ENCOUNTER IS A VIRTUAL VISIT DUE TO COVID-19 - PATIENT WAS NOT SEEN IN THE OFFICE. PATIENT HAS CONSENTED TO VIRTUAL VISIT / TELEMEDICINE VISIT   Location of patient: home  Location of provider: office  I discussed the limitations of evaluation and management by telemedicine and the availability of in person appointments. The patient expressed understanding and agreed to proceed.  History of Present Illness: Acute Symptoms started 08/14/2019: Had fever for several hours associated with aches, lack of smell and taste, mild cough, small amount of slightly colored sputum. Had + covid test 08/15/2019.     Review of Systems Denies nausea, vomiting, diarrhea No rash Has a mild but persisting headaches for the last 3 days. Admits to mild chest tightness and shortness of breath, shortness of breath was more noticeable 3 days ago.  Better today. No actual DOE.   Past Medical History:  Diagnosis Date  . COVID-19 virus infection 08/2019  . Hay fever    No hx of asthma  . Inguinal hernia, left 06/2017   Referred to Gen Surg 06/13/17  . Recurrent sinusitis   . Right shoulder pain 11/2018   RC tendonopathy, scapular dyskinesis (Dr. Paulla Fore).  . Tinnitus aurium, bilateral   . Vertigo     Past Surgical History:  Procedure Laterality Date  . ADENOIDECTOMY    . FOOT SURGERY Right    tendon re-attachment after laceration  . TONSILLECTOMY  approx 2005    Social History   Socioeconomic History  . Marital status: Married    Spouse name: Not on file  . Number of children: Not on file  . Years of education: Not on file  . Highest education level: Not on file   Occupational History  . Not on file  Social Needs  . Financial resource strain: Not on file  . Food insecurity    Worry: Not on file    Inability: Not on file  . Transportation needs    Medical: Not on file    Non-medical: Not on file  Tobacco Use  . Smoking status: Never Smoker  . Smokeless tobacco: Never Used  Substance and Sexual Activity  . Alcohol use: Yes    Comment: socially  . Drug use: No  . Sexual activity: Not on file  Lifestyle  . Physical activity    Days per week: Not on file    Minutes per session: Not on file  . Stress: Not on file  Relationships  . Social Herbalist on phone: Not on file    Gets together: Not on file    Attends religious service: Not on file    Active member of club or organization: Not on file    Attends meetings of clubs or organizations: Not on file    Relationship status: Not on file  . Intimate partner violence    Fear of current or ex partner: Not on file    Emotionally abused: Not on file    Physically abused: Not on file    Forced sexual activity: Not on file  Other Topics Concern  . Not on file  Social  History Narrative   Married, 2 sons, 1 daughter.  Two brothers: healthy.   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   College: Bachelors degree from [employer]   No tob, occ soc alcohol.  No drugs.   Hobbies: fishing, cooking, exercise/golf      Allergies as of 08/17/2019   No Known Allergies     Medication List       Accurate as of August 17, 2019 11:59 PM. If you have any questions, ask your nurse or doctor.        azithromycin 250 MG tablet Commonly known as: Zithromax Z-Pak 2 tabs a day the first day, then 1 tab a day x 4 days Started by: Willow OraJose Joelys Staubs, MD           Objective:   Physical Exam There were no vitals taken for this visit. This is a Forensic psychologistvirtual video.  He is alert oriented x3, speaking in complete sentences, no respiratory distress    Assessment    43 year old gentleman, no  PMH, last BMI 28, presents with  COVID-19: I discussed his care in detail, see message below, Z-Pak sent.  The treatment for COVID-19 at this point is: Rest, drink plenty of fluids, take Tylenol if you have fever or pain. Start taking the antibiotic called Zithromax For cough, get Robitussin-DM or Mucinex DM. Protect your family, use a mask when you are in the same room with them. Protect your community, stay home You will stop being contagious in approximately 14 days from the day you were tested and provided you are better and have no fever If possible, get device to check your oxygen level.  Normal oxygen levels are 96% to 100%.  If you are able to check, do so 2 to 3 times a day.  If your oxygen level starts going below 94%:  please go to the ER Also, go to the ER if you feel like you are more short of breath than today or if you have increasing chest pain, high fever, severe headache  or rash.   F2F 25 min, multiple questions answer  I discussed the assessment and treatment plan with the patient. The patient was provided an opportunity to ask questions and all were answered. The patient agreed with the plan and demonstrated an understanding of the instructions.   The patient was advised to call back or seek an in-person evaluation if the symptoms worsen or if the condition fails to improve as anticipated.

## 2019-08-18 ENCOUNTER — Encounter: Payer: Self-pay | Admitting: Family Medicine

## 2019-11-30 ENCOUNTER — Encounter: Payer: Self-pay | Admitting: Family Medicine

## 2019-11-30 ENCOUNTER — Other Ambulatory Visit: Payer: Self-pay

## 2019-11-30 ENCOUNTER — Ambulatory Visit: Payer: 59 | Admitting: Family Medicine

## 2019-11-30 VITALS — BP 102/61 | HR 65 | Temp 98.6°F | Resp 16 | Ht 71.0 in | Wt 211.4 lb

## 2019-11-30 DIAGNOSIS — Z23 Encounter for immunization: Secondary | ICD-10-CM | POA: Diagnosis not present

## 2019-11-30 DIAGNOSIS — H9313 Tinnitus, bilateral: Secondary | ICD-10-CM | POA: Diagnosis not present

## 2019-11-30 DIAGNOSIS — K2100 Gastro-esophageal reflux disease with esophagitis, without bleeding: Secondary | ICD-10-CM | POA: Diagnosis not present

## 2019-11-30 DIAGNOSIS — K209 Esophagitis, unspecified without bleeding: Secondary | ICD-10-CM

## 2019-11-30 MED ORDER — FAMOTIDINE 40 MG PO TABS: ORAL_TABLET | ORAL | 0 refills | Status: DC

## 2019-11-30 MED ORDER — PANTOPRAZOLE SODIUM 40 MG PO TBEC: DELAYED_RELEASE_TABLET | ORAL | 1 refills | Status: DC

## 2019-11-30 NOTE — Progress Notes (Signed)
OFFICE VISIT  11/30/2019   CC:  Chief Complaint  Patient presents with  . Heartburn    x2-3 months, comes and goes wile eating, mainly with spicy/hot foods or liquids    HPI:    Patient is a 43 y.o. Caucasian male who presents for "heartburn". Onset several months ago. Feels some discomfort when swallowing foods, gets burning sensation in substernal area as it goes down.  No feeling of the food getting stuck. Occ feels substernal pain/subxyphoid fullness separate from eating, like when lying in bed.  Sometimes gets rising burning sensation.  However, he says he usually doesn't have any of these sx's AFTER eating-->just has them WHILE eating. Worse with spicy foods and hot liquids.  No regurg or water brash.  Occ overeats and doesn't consistently have the sx's with this.  No cough, no ST, no need to clear throat.  No globus sensation.  Doesn't have hoarse voice.  Has tried some rolaids, otc H2 blocker and it doesn't help much. Started otc prilosec most days, did not help much at all.  ROS: no fevers,no SOB, no wheezing, no cough, no dizziness, no HAs, no rashes, no melena/hematochezia.  No polyuria or polydipsia.  No myalgias or arthralgias.  No n/v, no change in bowel habits.  Has 1 yr hx of ringing -high pitched-in both ears, without any sense of hearing loss. In this time frame he has also had a few very brief episodes of vertigo--lasting only 2-3 seconds. No abnormal gait/veering.  No loss of coordination.  No hx of head trauma.  Past Medical History:  Diagnosis Date  . COVID-19 virus infection 08/2019  . Hay fever    No hx of asthma  . Inguinal hernia, left 06/2017   Referred to Gen Surg 06/13/17  . Recurrent sinusitis   . Right shoulder pain 11/2018   RC tendonopathy, scapular dyskinesis (Dr. Paulla Fore).  . Tinnitus aurium, bilateral   . Vertigo     Past Surgical History:  Procedure Laterality Date  . ADENOIDECTOMY    . FOOT SURGERY Right    tendon re-attachment after  laceration  . TONSILLECTOMY  approx 2005    Outpatient Medications Prior to Visit  Medication Sig Dispense Refill  . Omeprazole Magnesium (PRILOSEC OTC PO) Take by mouth daily.    Marland Kitchen azithromycin (ZITHROMAX Z-PAK) 250 MG tablet 2 tabs a day the first day, then 1 tab a day x 4 days (Patient not taking: Reported on 11/30/2019) 6 tablet 0   No facility-administered medications prior to visit.    No Known Allergies  ROS As per HPI  PE: Blood pressure 102/61, pulse 65, temperature 98.6 F (37 C), temperature source Temporal, resp. rate 16, height 5\' 11"  (1.803 m), weight 211 lb 6.4 oz (95.9 kg), SpO2 96 %. Gen: Alert, well appearing.  Patient is oriented to person, place, time, and situation. AFFECT: pleasant, lucid thought and speech. PIR:JJOA: no injection, icteris, swelling, or exudate.  EOMI, PERRLA. Mouth: lips without lesion/swelling.  Oral mucosa pink and moist. Oropharynx without erythema, exudate, or swelling.  CV: RRR, no m/r/g.   LUNGS: CTA bilat, nonlabored resps, good aeration in all lung fields. ABD: soft, NT, ND, BS normal.  No hepatospenomegaly or mass.  No bruits. EXT: no clubbing or cyanosis.  no edema.    LABS:    Chemistry      Component Value Date/Time   NA 139 10/19/2018 1054   K 4.5 10/19/2018 1054   CL 102 10/19/2018 1054   CO2  31 10/19/2018 1054   BUN 15 10/19/2018 1054   CREATININE 1.03 10/19/2018 1054      Component Value Date/Time   CALCIUM 9.8 10/19/2018 1054   ALKPHOS 45 10/19/2018 1054   AST 30 10/19/2018 1054   ALT 23 10/19/2018 1054   BILITOT 0.8 10/19/2018 1054     Lab Results  Component Value Date   WBC 4.3 10/19/2018   HGB 15.6 10/19/2018   HCT 46.4 10/19/2018   MCV 96.0 10/19/2018   PLT 276.0 10/19/2018     IMPRESSION AND PLAN:  1) Substernal burning and occ epigastric fullness/pain-->sx's not matching real well with classic GERD, but I do think he has esophagitis.  Question eosinophilic esophagitis. Discussed options today,  decided to hold off on GI referral at this time, but will start pantoprazole 40mg  qAM, pepcid 40mg  qPM, GERD-friendly diet.  If not signi improved with this in 2 wks or so then he'll either return or he'll call to request GI referral.  2) Tinnitus, bilat: w/out subjective hearing impairment, with a couple of brief episodes of vertigo of unclear significance.  Pt was in favor of ENT referral today so I ordered this.  3) Preventative healthcare: flu vaccine given today.  An After Visit Summary was printed and given to the patient.  FOLLOW UP: No follow-ups on file.  Signed:  , MD           11/30/2019

## 2019-12-23 ENCOUNTER — Other Ambulatory Visit: Payer: Self-pay | Admitting: Family Medicine

## 2019-12-26 HISTORY — PX: OTHER SURGICAL HISTORY: SHX169

## 2020-01-01 ENCOUNTER — Encounter: Payer: Self-pay | Admitting: Family Medicine

## 2020-01-31 ENCOUNTER — Encounter: Payer: Self-pay | Admitting: Family Medicine

## 2020-03-18 ENCOUNTER — Encounter: Payer: Self-pay | Admitting: Gastroenterology

## 2020-03-18 ENCOUNTER — Telehealth: Payer: Self-pay

## 2020-03-18 DIAGNOSIS — R12 Heartburn: Secondary | ICD-10-CM

## 2020-03-18 DIAGNOSIS — K2100 Gastro-esophageal reflux disease with esophagitis, without bleeding: Secondary | ICD-10-CM

## 2020-03-18 NOTE — Telephone Encounter (Signed)
Patient notified regarding referral. Advised [facility] had contacted to schedule an appt

## 2020-03-18 NOTE — Telephone Encounter (Signed)
OK, GI referral ordered. 

## 2020-03-18 NOTE — Telephone Encounter (Signed)
Pt last seen 11/30/19, "Discussed options today, decided to hold off on GI referral at this time, but will start pantoprazole 40mg  qAM, pepcid 40mg  qPM, GERD-friendly diet.  If not signi improved with this in 2 wks or so then he'll either return or he'll call to request GI referral"  Please advise on referral.

## 2020-03-18 NOTE — Telephone Encounter (Signed)
Patient states his medication for indigestion is no longer working. Patient is requesting call back about getting a referral

## 2020-04-06 DIAGNOSIS — K2100 Gastro-esophageal reflux disease with esophagitis, without bleeding: Secondary | ICD-10-CM

## 2020-04-06 HISTORY — DX: Gastro-esophageal reflux disease with esophagitis, without bleeding: K21.00

## 2020-04-18 ENCOUNTER — Ambulatory Visit: Payer: 59 | Admitting: Gastroenterology

## 2020-04-18 ENCOUNTER — Encounter: Payer: Self-pay | Admitting: Gastroenterology

## 2020-04-18 VITALS — BP 100/74 | HR 60 | Temp 97.1°F | Ht 71.0 in | Wt 205.4 lb

## 2020-04-18 DIAGNOSIS — R1310 Dysphagia, unspecified: Secondary | ICD-10-CM

## 2020-04-18 DIAGNOSIS — K219 Gastro-esophageal reflux disease without esophagitis: Secondary | ICD-10-CM

## 2020-04-18 DIAGNOSIS — R1013 Epigastric pain: Secondary | ICD-10-CM

## 2020-04-18 DIAGNOSIS — R1319 Other dysphagia: Secondary | ICD-10-CM

## 2020-04-18 MED ORDER — OMEPRAZOLE 40 MG PO CPDR
40.0000 mg | DELAYED_RELEASE_CAPSULE | Freq: Two times a day (BID) | ORAL | 3 refills | Status: DC
Start: 2020-04-18 — End: 2021-06-11

## 2020-04-18 NOTE — Patient Instructions (Addendum)
If you are age 44 or older, your body mass index should be between 23-30. Your Body mass index is 28.65 kg/m. If this is out of the aforementioned range listed, please consider follow up with your Primary Care Provider.  If you are age 97 or younger, your body mass index should be between 19-25. Your Body mass index is 28.65 kg/m. If this is out of the aformentioned range listed, please consider follow up with your Primary Care Provider.    You have been scheduled for an endoscopy. Please follow written instructions given to you at your visit today. If you use inhalers (even only as needed), please bring them with you on the day of your procedure.   We have sent the following medications to your pharmacy for you to pick up at your convenience: Omeprazole   Start Omeprazole 40mg  - Twice daily.   Due to recent changes in healthcare laws, you may see the results of your imaging and laboratory studies on MyChart before your provider has had a chance to review them.  We understand that in some cases there may be results that are confusing or concerning to you. Not all laboratory results come back in the same time frame and the provider may be waiting for multiple results in order to interpret others.  Please give 48 hours in order for your provider to thoroughly review all the results before contacting the office for clarification of your results.   Thank you for choosing me and  Gastroenterology.  Dr. [place]

## 2020-04-20 ENCOUNTER — Encounter: Payer: Self-pay | Admitting: Gastroenterology

## 2020-04-20 DIAGNOSIS — R1013 Epigastric pain: Secondary | ICD-10-CM | POA: Insufficient documentation

## 2020-04-20 DIAGNOSIS — R1319 Other dysphagia: Secondary | ICD-10-CM | POA: Insufficient documentation

## 2020-04-20 DIAGNOSIS — K219 Gastro-esophageal reflux disease without esophagitis: Secondary | ICD-10-CM | POA: Insufficient documentation

## 2020-04-20 DIAGNOSIS — R0789 Other chest pain: Secondary | ICD-10-CM | POA: Insufficient documentation

## 2020-04-20 NOTE — Progress Notes (Addendum)
[place] VISIT   Primary Care Provider Tammi Sou, MD [address] [place] [address] [phone]  Referring Provider Tammi Sou, MD [address] [place],  [place] [address] [phone]  Patient Profile: John Henderson is a 44 y.o. male with a pmh significant for seasonal allergies, prior inguinal hernia, status post RC tendinopathy allergies/sinus issues, GERD.  The patient presents to the [facility] for an evaluation and management of problem(s) noted below:  Problem List 1. Gastroesophageal reflux disease, unspecified whether esophagitis present   2. Esophageal dysphagia   3. Odynophagia   4. Epigastric pain     History of Present Illness This is the patient's first visit to the outpatient [place] clinic.  He has experienced issues with acid reflux/pyrosis over years.  Over the course of the last 4 to 5 months however, he has had more significant burning and discomfort in the very bottom of his esophagus as he feels foods are passing right below the sternum into the stomach.  This is burning in his chest is bothersome.  At times dry foods such as breads or large pieces of meat may have difficulty initially passing before they make their way down but they do cause some pain.  He denies significant episodes of regurgitation.  No history of asthma as a child or as a young adult.  He was prescribed omeprazole in February with some benefit initially.  He transitioned his diet to try and eat healthier and had some improvement with that as well.  He stopped his PPI after things settled and were still causing issues.  At this point he is not taking PPI his symptoms worsen.  He denies any coffee-ground emesis or hematemesis.  He denies any melena medic easier.  No other significant GI symptoms.  He denies significant nonsteroidal or BC/Goody powder use.  He has never had an upper or lower endoscopy.  GI Review  of Systems Positive as above Negative for change in bowel habits, nausea, vomiting  Review of Systems General: Denies fevers/chills/weight loss unintentionally HEENT: Denies oral lesions Cardiovascular: Denies chest pain/palpitations Pulmonary: Denies shortness of breath/nocturnal cough Gastroenterological: See HPI Genitourinary: Denies darkened urine Hematological: Denies easy bruising/bleeding Endocrine: Denies temperature intolerance Dermatological: Denies jaundice Psychological: Mood is stable   Medications Current Outpatient Medications  Medication Sig Dispense Refill  . famotidine-calcium carbonate-magnesium hydroxide (PEPCID COMPLETE) 10-800-165 MG chewable tablet Chew 1 tablet by mouth daily as needed.    . Omeprazole Magnesium (PRILOSEC OTC PO) Take 1 tablet by mouth as needed.     Marland Kitchen omeprazole (PRILOSEC) 40 MG capsule Take 1 capsule (40 mg total) by mouth in the morning and at bedtime. 90 capsule 3   No current facility-administered medications for this visit.    Allergies No Known Allergies  Histories Past Medical History:  Diagnosis Date  . COVID-19 virus infection 08/2019  . Hay fever    No hx of asthma  . Inguinal hernia, left 06/2017   Referred to Gen Surg 06/13/17  . Recurrent sinusitis   . Right shoulder pain 11/2018   RC tendonopathy, scapular dyskinesis (Dr. Paulla Fore).  . Tinnitus aurium, bilateral   . Venous insufficiency    Left leg varicose vein dz  . Vertigo    Past Surgical History:  Procedure Laterality Date  . ADENOIDECTOMY    . Duplex Ultrasound Study  12/26/2019   Status post prior left leg great saphenous vein EVLT  . FOOT  SURGERY Right    tendon re-attachment after laceration  . TONSILLECTOMY  approx 2005   Social History   Socioeconomic History  . Marital status: Married    Spouse name: Not on file  . Number of children: Not on file  . Years of education: Not on file  . Highest education level: Not on file  Occupational History    . Not on file  Tobacco Use  . Smoking status: Never Smoker  . Smokeless tobacco: Never Used  Substance and Sexual Activity  . Alcohol use: Yes    Comment: couple nights weekly- 4-5 drinks  . Drug use: No  . Sexual activity: Not on file  Other Topics Concern  . Not on file  Social History Narrative   Married, 2 sons, 1 daughter.  Two brothers: healthy.   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   College: Bachelors degree from [place].   No tob, occ soc alcohol.  No drugs.   Hobbies: fishing, cooking, Contractor   Social Determinants of Health   Financial Resource Strain:   . Difficulty of Paying Living Expenses:   Food Insecurity:   . Worried About Programme researcher, broadcasting/film/video in the Last Year:   . Barista in the Last Year:   Transportation Needs:   . Freight forwarder (Medical):   Marland Kitchen Lack of Transportation (Non-Medical):   Physical Activity:   . Days of Exercise per Week:   . Minutes of Exercise per Session:   Stress:   . Feeling of Stress :   Social Connections:   . Frequency of Communication with Friends and Family:   . Frequency of Social Gatherings with Friends and Family:   . Attends Religious Services:   . Active Member of Clubs or Organizations:   . Attends Banker Meetings:   Marland Kitchen Marital Status:   Intimate Partner Violence:   . Fear of Current or Ex-Partner:   . Emotionally Abused:   Marland Kitchen Physically Abused:   . Sexually Abused:    Family History  Problem Relation Age of Onset  . Bladder Cancer Father   . Colon cancer Neg Hx   . Esophageal cancer Neg Hx   . Pancreatic cancer Neg Hx   . Stomach cancer Neg Hx   . Liver disease Neg Hx   . Inflammatory bowel disease Neg Hx   . Rectal cancer Neg Hx    I have reviewed his medical, social, and family history in detail and updated the electronic medical record as necessary.    PHYSICAL EXAMINATION  BP 100/74   Pulse 60   Temp (!) 97.1 F (36.2 C)   Ht 5\' 11"  (1.803 m)   Wt  205 lb 6.4 oz (93.2 kg)   BMI 28.65 kg/m  Wt Readings from Last 3 Encounters:  04/18/20 205 lb 6.4 oz (93.2 kg)  11/30/19 211 lb 6.4 oz (95.9 kg)  07/25/19 202 lb (91.6 kg)  GEN: NAD, appears stated age, doesn't appear chronically ill PSYCH: Cooperative, without pressured speech EYE: Conjunctivae pink, sclerae anicteric ENT: MMM, without oral ulcers, no erythema or exudates noted CV: Nontachycardic RESP: No wheezing present GI: NABS, soft, NT/ND, without rebound or guarding MSK/EXT: No significant lower extremity edema SKIN: No jaundice, no spider angiomata NEURO:  Alert & Oriented x 3, no focal deficits   REVIEW OF DATA  I reviewed the following data at the time of this encounter:  GI Procedures and Studies  No relevant GI studies  to review  Laboratory Studies  Reviewed those in epic  Imaging Studies  No relevant imaging studies to review   ASSESSMENT  Mr. Todt is a 44 y.o. male with a pmh significant for seasonal allergies, prior inguinal hernia, status post RC tendinopathy allergies/sinus issues, GERD.  The patient is seen today for evaluation and management of:  1. Gastroesophageal reflux disease, unspecified whether esophagitis present   2. Esophageal dysphagia   3. Odynophagia   4. Epigastric pain    The patient is hemodynamically stable.  Patient has symptoms of what seems to be pyrosis and likely esophagitis.  He had some initial improvement with PPI but then after things did not continue to improve he stopped this.  I suspect he has esophagitis.  His symptoms of dysphagia and odynophagia as food passes at the bottom of his esophagus suggest this.  We will plan to have high-dose PPI therapy for the next few weeks and move forward with a diagnostic endoscopy with at least esophageal and gastric biopsies.  If patient continues to have symptoms with an unremarkable upper endoscopy or without clinical improvement on PPI therapy, then we will proceed with pH impedance  testing and manometry.  Patient will also likely need abdominal imaging or cross-sectional imaging if endoscopy is unremarkable and no improvement on PPI therapy.  The risks and benefits of endoscopic evaluation were discussed with the patient; these include but are not limited to the risk of perforation, infection, bleeding, missed lesions, lack of diagnosis, severe illness requiring hospitalization, as well as anesthesia and sedation related illnesses.  The patient is agreeable to proceed.  All patient questions were answered, to the best of my ability, and the patient agrees to the aforementioned plan of action with follow-up as indicated.   PLAN  Omeprazole 40 mg twice daily May use Pepcid nightly if needed Diagnostic endoscopy to be scheduled in 6 to 8 weeks Patient to call back if symptoms progress before then If nothing significant is found and patient continues to have issues then will need pH impedance/manometry/abdominal ultrasound or CT imaging   Orders Placed This Encounter  Procedures  . Ambulatory referral to Gastroenterology    New Prescriptions   OMEPRAZOLE (PRILOSEC) 40 MG CAPSULE    Take 1 capsule (40 mg total) by mouth in the morning and at bedtime.   Modified Medications   No medications on file    Planned Follow Up No follow-ups on file.   Total Time in Face-to-Face and in Coordination of Care for patient including independent/personal interpretation/review of prior testing, medical history, examination, medication adjustment, communicating results with the patient directly, and documentation with the EHR is 35 minutes.   Corliss Parish, MD [facility] Office # [phone]

## 2020-04-21 DIAGNOSIS — R1310 Dysphagia, unspecified: Secondary | ICD-10-CM | POA: Insufficient documentation

## 2020-04-30 ENCOUNTER — Encounter: Payer: Self-pay | Admitting: Family Medicine

## 2020-05-23 ENCOUNTER — Encounter: Payer: Self-pay | Admitting: Gastroenterology

## 2020-06-03 ENCOUNTER — Encounter: Payer: 59 | Admitting: Gastroenterology

## 2020-07-02 ENCOUNTER — Encounter: Payer: 59 | Admitting: Gastroenterology

## 2021-02-21 ENCOUNTER — Ambulatory Visit: Payer: 59 | Admitting: Family Medicine

## 2021-05-26 ENCOUNTER — Telehealth: Payer: Self-pay

## 2021-05-26 MED ORDER — CICLOPIROX OLAMINE 0.77 % EX CREA: TOPICAL_CREAM | Freq: Two times a day (BID) | CUTANEOUS | 1 refills | Status: DC

## 2021-05-26 NOTE — Telephone Encounter (Signed)
Ok, cyclopirox cream eRx'd

## 2021-05-26 NOTE — Telephone Encounter (Signed)
Patient refill request.  Patient has splotchy skin as before. He does not have any cream left. Patient can be reached at [phone]  ciclopirox (LOPROX) 0.77 % cream [[id number]]    [facility] [place]

## 2021-05-26 NOTE — Telephone Encounter (Signed)
Lvm to inform pt that rx has been sent

## 2021-05-26 NOTE — Telephone Encounter (Signed)
Please advise. Pt hasnt been seen since 11/2019. Rx not on current med list

## 2021-05-26 NOTE — Telephone Encounter (Signed)
Please see below.

## 2021-06-11 ENCOUNTER — Encounter: Payer: Self-pay | Admitting: Family Medicine

## 2021-06-11 ENCOUNTER — Telehealth (INDEPENDENT_AMBULATORY_CARE_PROVIDER_SITE_OTHER): Payer: No Typology Code available for payment source | Admitting: Family Medicine

## 2021-06-11 ENCOUNTER — Telehealth: Payer: Self-pay

## 2021-06-11 DIAGNOSIS — B360 Pityriasis versicolor: Secondary | ICD-10-CM | POA: Diagnosis not present

## 2021-06-11 MED ORDER — CLOTRIMAZOLE 1 % EX CREA: 1.0000 "application " | TOPICAL_CREAM | Freq: Two times a day (BID) | CUTANEOUS | 2 refills | Status: DC

## 2021-06-11 MED ORDER — SELENIUM SULFIDE 2.5 % EX LOTN: TOPICAL_LOTION | CUTANEOUS | 2 refills | Status: DC

## 2021-06-11 NOTE — Telephone Encounter (Signed)
Patient states that Dr. Milinda Cave prescribed him a cream a few weeks ago, but it is not working.  He said he was a  cream a few years ago, and it worked.  I told patient Dr. Milinda Cave out of office until next week, and he said that Dr. Claiborne Billings has seen him and prescribed the same thing. Patient could not give me name of cream or exact date of when prescribed.  Patient can be reached at [phone]

## 2021-06-11 NOTE — Patient Instructions (Addendum)
I have called in the same cream you used back in 2020 and the selenium sulfide shampoo to wash back/shoulders.   Tinea Versicolor  Tinea versicolor is a skin infection. It is caused by a type of yeast. It is normal for some yeast to be on your skin, but too much yeast causes thisinfection. The infection causes a rash of light or dark patches on your skin. The rash is most common on the chest, back, neck, or upper arms. The infection usually does not cause other problems. If it is treated, it will probably go away in a few weeks. The infection cannot be spread from one person to another (is not contagious). Follow these instructions at home: Use over-the-counter and prescription medicines only as told by your doctor. Scrub your skin every day with dandruff shampoo as told by your doctor. Do not scratch your skin in the rash area. Avoid places that are hot and humid. Do not use tanning booths. Try to avoid sweating a lot. Contact a doctor if: Your symptoms get worse. You have a fever. You have redness, swelling, or pain in the rash area. You have fluid or blood coming from your rash. Your rash feels warm to the touch. You have pus or a bad smell coming from your rash. Your rash comes back (recurs) after treatment. Summary Tinea versicolor is a skin infection. It causes a rash of light or dark patches on your skin. The rash is most common on the chest, back, neck, or upper arms. This infection usually does not cause other problems. Use over-the-counter and prescription medicines only as told by your doctor. If the infection is treated, it will probably go away in a few weeks. This information is not intended to replace advice given to you by your health care provider. Make sure you discuss any questions you have with your healthcare provider. Document Revised: 09/17/2020 Document Reviewed: 09/18/2020 Elsevier Patient Education  2022 [employer].

## 2021-06-11 NOTE — Telephone Encounter (Signed)
Spoke with pt and advised an appt would be helpful to get another rx from another provider. Appt scheduled for 2pm

## 2021-06-11 NOTE — Progress Notes (Signed)
VIRTUAL VISIT VIA VIDEO  I connected with John Henderson on 06/11/21 at  2:00 PM EDT by elemedicine application and verified that I am speaking with the correct person using two identifiers. Location patient: Home Location provider: [facility], Office Persons participating in the virtual visit: Patient, Dr. Claiborne Billings and Reece Agar. Cesar, CMA  I discussed the limitations of evaluation and management by telemedicine and the availability of in person appointments. The patient expressed understanding and agreed to proceed.   SUBJECTIVE Chief Complaint  Patient presents with   Rash    Pt c/o discoloration on both shoulder b/l x 2 mos; worsen last week when was given ciclopirox     HPI: John Henderson is a 45 y.o. male present with complaints of hypopigmented rash of his back and shoulders. He has has a similar rash in the past that resolved with clotrimazole cream. He has ciclopirox cream at home, however he feels the rash became worse after use. He states it is not all that itchy.  ROS: See pertinent positives and negatives per HPI.  Patient Active Problem List   Diagnosis Date Noted   Tinea versicolor 06/11/2021   Odynophagia 04/21/2020   Gastroesophageal reflux disease 04/20/2020   Chest discomfort 04/20/2020   Esophageal dysphagia 04/20/2020   Epigastric pain 04/20/2020   Tinea cruris 11/11/2018   Biceps tendinitis of right upper extremity 11/11/2018   Epididymitis 09/27/2015   Health maintenance examination 01/10/2014    Social History   Tobacco Use   Smoking status: Never   Smokeless tobacco: Never  Substance Use Topics   Alcohol use: Yes    Comment: couple nights weekly- 4-5 drinks    Current Outpatient Medications:    ciclopirox (LOPROX) 0.77 % cream, Apply topically 2 (two) times daily., Disp: 30 g, Rfl: 1   clotrimazole (ANTIFUNGAL, CLOTRIMAZOLE,) 1 % cream, Apply 1 application topically 2 (two) times daily., Disp: 60 g, Rfl: 2   famotidine-calcium  carbonate-magnesium hydroxide (PEPCID COMPLETE) 10-800-165 MG chewable tablet, Chew 1 tablet by mouth daily as needed., Disp: , Rfl:    Omeprazole Magnesium (PRILOSEC OTC PO), Take 1 tablet by mouth as needed. , Disp: , Rfl:    selenium sulfide (SELSUN) 2.5 % shampoo, Lather over affected area and leave in place for 10 minutes, then rinse. Use for 7-10 days., Disp: 118 mL, Rfl: 2  No Known Allergies  OBJECTIVE: There were no vitals taken for this visit. Gen: No acute distress. Nontoxic in appearance.  HENT: AT. [place].  MMM.  Eyes:Pupils Equal Round Reactive to light, Extraocular movements intact,  Conjunctiva without redness, discharge or icterus. Skin: splotchy hypopigmentation back and bilateral shoulders.  no purpura or petechiae.  Neuro:  Alert. Oriented x3  Psych: Normal affect and demeanor. Normal speech. Normal thought content and judgment.  ASSESSMENT AND PLAN: John Henderson is a 45 y.o. male present for  Tinea versicolor Clotrimazole cream and selenium sulfide shampoo (to wash daily for 7 days). He is aware he will need to use the cream for about 3 weeks. F/u PRN   Felix Pacini, DO 06/11/2021   Return if symptoms worsen or fail to improve.  No orders of the defined types were placed in this encounter.  Meds ordered this encounter  Medications   clotrimazole (ANTIFUNGAL, CLOTRIMAZOLE,) 1 % cream    Sig: Apply 1 application topically 2 (two) times daily.    Dispense:  60 g    Refill:  2   selenium sulfide (SELSUN) 2.5 % shampoo  Sig: Lather over affected area and leave in place for 10 minutes, then rinse. Use for 7-10 days.    Dispense:  118 mL    Refill:  2   Referral Orders  No referral(s) requested today

## 2021-09-11 ENCOUNTER — Ambulatory Visit: Payer: No Typology Code available for payment source | Admitting: Family Medicine

## 2022-07-01 ENCOUNTER — Telehealth: Payer: Self-pay | Admitting: Family Medicine

## 2022-07-01 MED ORDER — AZITHROMYCIN 500 MG PO TABS: ORAL_TABLET | ORAL | 0 refills | Status: DC

## 2022-07-01 MED ORDER — ATOVAQUONE-PROGUANIL HCL 250-100 MG PO TABS: ORAL_TABLET | ORAL | 0 refills | Status: DC

## 2022-07-01 NOTE — Telephone Encounter (Signed)
Pt requesting rx for malaria prophylaxis (going to [place]). Also requests rx for traveler's diarrhea to have on hand. I rx'd azith 500 qd x 3d as well as atovaquone-proguanil 250/100, 1 qd starting 2 days prior to exposure and continue through 7 days after exposure.

## 2022-09-22 HISTORY — PX: COLONOSCOPY: SHX174

## 2022-09-22 HISTORY — PX: ESOPHAGOGASTRODUODENOSCOPY: SHX1529

## 2022-09-22 LAB — HM COLONOSCOPY

## 2022-09-23 ENCOUNTER — Encounter: Payer: Self-pay | Admitting: Family Medicine

## 2022-10-02 ENCOUNTER — Encounter: Payer: Self-pay | Admitting: Family Medicine

## 2022-10-08 ENCOUNTER — Encounter: Payer: Self-pay | Admitting: Family Medicine

## 2023-03-22 ENCOUNTER — Encounter: Payer: Self-pay | Admitting: Family Medicine

## 2023-03-22 ENCOUNTER — Telehealth: Payer: Self-pay | Admitting: Family Medicine

## 2023-03-22 ENCOUNTER — Ambulatory Visit: Payer: 59 | Admitting: Family Medicine

## 2023-03-22 VITALS — BP 142/78 | HR 96 | Temp 99.3°F | Ht 71.0 in | Wt 200.0 lb

## 2023-03-22 DIAGNOSIS — R350 Frequency of micturition: Secondary | ICD-10-CM | POA: Diagnosis not present

## 2023-03-22 DIAGNOSIS — G43119 Migraine with aura, intractable, without status migrainosus: Secondary | ICD-10-CM

## 2023-03-22 DIAGNOSIS — R5081 Fever presenting with conditions classified elsewhere: Secondary | ICD-10-CM | POA: Diagnosis not present

## 2023-03-22 DIAGNOSIS — B028 Zoster with other complications: Secondary | ICD-10-CM | POA: Diagnosis not present

## 2023-03-22 LAB — POCT URINALYSIS DIPSTICK
Bilirubin, UA: NEGATIVE
Blood, UA: NEGATIVE
Glucose, UA: NEGATIVE
Leukocytes, UA: NEGATIVE
Nitrite, UA: NEGATIVE
Protein, UA: NEGATIVE
Spec Grav, UA: 1.02 (ref 1.010–1.025)
Urobilinogen, UA: 1 E.U./dL
pH, UA: 6 (ref 5.0–8.0)

## 2023-03-22 LAB — COMPREHENSIVE METABOLIC PANEL
ALT: 17 U/L (ref 0–53)
AST: 19 U/L (ref 0–37)
Albumin: 4.8 g/dL (ref 3.5–5.2)
Alkaline Phosphatase: 49 U/L (ref 39–117)
BUN: 11 mg/dL (ref 6–23)
CO2: 24 mEq/L (ref 19–32)
Calcium: 9.4 mg/dL (ref 8.4–10.5)
Chloride: 100 mEq/L (ref 96–112)
Creatinine, Ser: 1.17 mg/dL (ref 0.40–1.50)
GFR: 74.39 mL/min (ref >60.00)
Glucose, Bld: 92 mg/dL (ref 70–99)
Potassium: 4.3 mEq/L (ref 3.5–5.1)
Sodium: 135 mEq/L (ref 135–145)
Total Bilirubin: 1 mg/dL (ref 0.2–1.2)
Total Protein: 7 g/dL (ref 6.0–8.3)

## 2023-03-22 LAB — CBC WITH DIFFERENTIAL/PLATELET
Basophils Absolute: 0.1 10*3/uL (ref 0.0–0.1)
Basophils Relative: 0.8 % (ref 0.0–3.0)
Eosinophils Absolute: 0.1 10*3/uL (ref 0.0–0.7)
Eosinophils Relative: 1.3 % (ref 0.0–5.0)
HCT: 48.8 % (ref 39.0–52.0)
Hemoglobin: 16.6 g/dL (ref 13.0–17.0)
Lymphocytes Relative: 17.2 % (ref 12.0–46.0)
Lymphs Abs: 1.3 10*3/uL (ref 0.7–4.0)
MCHC: 34 g/dL (ref 30.0–36.0)
MCV: 96.5 fl (ref 78.0–100.0)
Monocytes Absolute: 0.5 10*3/uL (ref 0.1–1.0)
Monocytes Relative: 7.3 % (ref 3.0–12.0)
Neutro Abs: 5.5 10*3/uL (ref 1.4–7.7)
Neutrophils Relative %: 73.4 % (ref 43.0–77.0)
Platelets: 291 10*3/uL (ref 150.0–400.0)
RBC: 5.06 Mil/uL (ref 4.22–5.81)
RDW: 13.2 % (ref 11.5–15.5)
WBC: 7.6 10*3/uL (ref 4.0–10.5)

## 2023-03-22 MED ORDER — TRAMADOL HCL 50 MG PO TABS: ORAL_TABLET | ORAL | 0 refills | Status: DC

## 2023-03-22 MED ORDER — KETOROLAC TROMETHAMINE 60 MG/2ML IM SOLN
60.0000 mg | Freq: Once | INTRAMUSCULAR | Status: AC
  Administered 2023-03-22: 60 mg via INTRAMUSCULAR

## 2023-03-22 NOTE — Progress Notes (Signed)
OFFICE VISIT  03/22/2023  CC:  Chief Complaint  Patient presents with   Herpes Zoster    Rash onset 03/18/2023 Urgent care 03/21/2023 Rash, head ache, fever muscle pain 03/21/2023 Pt believes could be meningitis     Patient is a 47 y.o. male who presents for rash and headache.  HPI:  John Henderson started to get a rash in the region of the gluteal cleft on the right side several 2 to 3 days ago.  He went to urgent care yesterday and was diagnosed with shingles.  He was prescribed Valtrex. Yesterday afternoon he started to feel significant headache, body aches, and subjective fever.  The rash is only mildly uncomfortable.  It has not spread any significant amount. He also notes some urinary frequency and incomplete bladder emptying in the last day or so.  No dysuria. He also notes a slightly swollen lymph node in the left groin area.  No nasal congestion or runny nose, no sore throat, no cough, no nausea, no vomiting, no diarrhea. No joint swelling.  His neck feels mildly stiff but his pain is worse in the low back region.  He recalls no tick bites.  Prior to onset of the rash a couple days ago he felt completely well.  Past Medical History:  Diagnosis Date   COVID-19 virus infection 08/2019   GERD with esophagitis 04/2020   Hay fever    No hx of asthma   History of adenomatous polyp of colon    09/2022, recall 5 yrs   Inguinal hernia, left 06/2017   Referred to Gen Surg 06/13/17   Recurrent sinusitis    Right shoulder pain 11/2018   RC tendonopathy, scapular dyskinesis (Dr. Berline Chough).   Tinnitus aurium, bilateral    Venous insufficiency    Left leg varicose vein dz   Vertigo     Past Surgical History:  Procedure Laterality Date   ADENOIDECTOMY     COLONOSCOPY  09/22/2022   09/2022 Several adenomatous polyps.  recall 5 yrs   Duplex Ultrasound Study  12/26/2019   Status post prior left leg great saphenous vein EVLT   ESOPHAGOGASTRODUODENOSCOPY  09/22/2022   antral gastritis    FOOT SURGERY Right    tendon re-attachment after laceration   TONSILLECTOMY  approx 2005    Outpatient Medications Prior to Visit  Medication Sig Dispense Refill   atovaquone-proguanil (MALARONE) 250-100 MG TABS tablet 1 tab po qd starting 2 days prior to malaria exposure and continue through 7 days after exposure 12 tablet 0   azithromycin (ZITHROMAX) 500 MG tablet 1 tab po qd x 3d 3 tablet 0   ciclopirox (LOPROX) 0.77 % cream Apply topically 2 (two) times daily. 30 g 1   clotrimazole (ANTIFUNGAL, CLOTRIMAZOLE,) 1 % cream Apply 1 application topically 2 (two) times daily. 60 g 2   famotidine-calcium carbonate-magnesium hydroxide (PEPCID COMPLETE) 10-800-165 MG chewable tablet Chew 1 tablet by mouth daily as needed.     Omeprazole Magnesium (PRILOSEC OTC PO) Take 1 tablet by mouth as needed.      selenium sulfide (SELSUN) 2.5 % shampoo Lather over affected area and leave in place for 10 minutes, then rinse. Use for 7-10 days. 118 mL 2   No facility-administered medications prior to visit.    No Known Allergies  Review of Systems  As per HPI  PE:    03/22/2023    9:36 AM 04/18/2020    2:09 PM 11/30/2019    9:09 AM  Vitals with BMI  Height  5\' 11"  5\' 11"  5\' 11"   Weight 200 lbs 205 lbs 6 oz 211 lbs 6 oz  BMI 27.91 28.66 29.5  Systolic 142 100 216  Diastolic 78 74 61  Pulse 96 60 65     Physical Exam  Gen: Alert, tired-appearing.  Nontoxic appearing.  Patient is oriented to person, place, time, and situation. To the right of midline at the gluteal cleft there is a vesicular rash on erythematous base.  No ruptured lesions, no crusting. Left groin with approximately 1 cm rubbery and mobile lymph node is palpable at the level of the inguinal ligament. LABS:  Last CBC Lab Results  Component Value Date   WBC 4.3 10/19/2018   HGB 15.6 10/19/2018   HCT 46.4 10/19/2018   MCV 96.0 10/19/2018   RDW 13.0 10/19/2018   PLT 276.0 10/19/2018   Last metabolic panel Lab Results   Component Value Date   GLUCOSE 95 10/19/2018   NA 139 10/19/2018   K 4.5 10/19/2018   CL 102 10/19/2018   CO2 31 10/19/2018   BUN 15 10/19/2018   CREATININE 1.03 10/19/2018   CALCIUM 9.8 10/19/2018   PROT 7.2 10/19/2018   ALBUMIN 5.0 10/19/2018   BILITOT 0.8 10/19/2018   ALKPHOS 45 10/19/2018   AST 30 10/19/2018   ALT 23 10/19/2018   IMPRESSION AND PLAN:  #1 herpes zoster, with systemic symptoms. Most prominent is his headache. Given his severe symptoms we will go ahead and check CBC and c-Met. Toradol 60 mg IM today. UA today: 3+ ketones otherwise normal.  Tramadol 50 mg, 1-2 every 6 hours as needed, #20, no refill.  An After Visit Summary was printed and given to the patient.  FOLLOW UP: Return for 1-2d f/u fever and HA.  Signed:  Santiago Bumpers, MD           03/22/2023

## 2023-03-23 ENCOUNTER — Other Ambulatory Visit: Payer: Self-pay

## 2023-03-24 ENCOUNTER — Encounter: Payer: Self-pay | Admitting: Family Medicine

## 2023-03-24 ENCOUNTER — Ambulatory Visit: Payer: 59 | Admitting: Family Medicine

## 2023-03-24 VITALS — BP 124/82 | HR 78 | Temp 98.1°F | Ht 71.0 in | Wt 199.4 lb

## 2023-03-24 DIAGNOSIS — B028 Zoster with other complications: Secondary | ICD-10-CM | POA: Diagnosis not present

## 2023-03-24 NOTE — Progress Notes (Signed)
OFFICE VISIT  03/24/2023  CC:  Chief Complaint  Patient presents with   Follow-up    Shingles; pt is feeling much better but still not 100%     Patient is a 47 y.o. male who presents for follow-up fever, headache, zoster. A/P as of last visit: " herpes zoster, with systemic symptoms. Most prominent is his headache. Given his severe symptoms we will go ahead and check CBC and c-Met. Toradol 60 mg IM today. UA today: 3+ ketones otherwise normal. Tramadol 50 mg, 1-2 every 6 hours as needed, #20, no refill."  INTERIM HX: CBC and cMet were normal last visit.  UA was normal as well. John Henderson is feeling about 75% improved this morning.  Headache is mild, no more body aches, no fevers.  No neck stiffness.  The rash in the gluteal area stings only a little bit. Tramadol did not help his headache yesterday bu his wife had some leftover hydrocodone and that helped well. One of his friends was able to come over yesterday and give him some IV fluids as well.  He is taking the Valtrex as prescribed.   Past Medical History:  Diagnosis Date   COVID-19 virus infection 08/2019   GERD with esophagitis 04/2020   Hay fever    No hx of asthma   History of adenomatous polyp of colon    09/2022, recall 5 yrs   Inguinal hernia, left 06/2017   Referred to Gen Surg 06/13/17   Recurrent sinusitis    Right shoulder pain 11/2018   RC tendonopathy, scapular dyskinesis (Dr. Berline Chough).   Tinnitus aurium, bilateral    Venous insufficiency    Left leg varicose vein dz   Vertigo     Past Surgical History:  Procedure Laterality Date   ADENOIDECTOMY     COLONOSCOPY  09/22/2022   09/2022 Several adenomatous polyps.  recall 5 yrs   Duplex Ultrasound Study  12/26/2019   Status post prior left leg great saphenous vein EVLT   ESOPHAGOGASTRODUODENOSCOPY  09/22/2022   antral gastritis   FOOT SURGERY Right    tendon re-attachment after laceration   TONSILLECTOMY  approx 2005    Outpatient Medications Prior  to Visit  Medication Sig Dispense Refill   famotidine-calcium carbonate-magnesium hydroxide (PEPCID COMPLETE) 10-800-165 MG chewable tablet Chew 1 tablet by mouth daily as needed.     Omeprazole Magnesium (PRILOSEC OTC PO) Take 1 tablet by mouth as needed.      valACYclovir (VALTREX) 1000 MG tablet Take 1,000 mg by mouth 3 (three) times daily.     atovaquone-proguanil (MALARONE) 250-100 MG TABS tablet 1 tab po qd starting 2 days prior to malaria exposure and continue through 7 days after exposure (Patient not taking: Reported on 03/24/2023) 12 tablet 0   traMADol (ULTRAM) 50 MG tablet 1-2 tabs po q6h prn pain (Patient not taking: Reported on 03/24/2023) 20 tablet 0   azithromycin (ZITHROMAX) 500 MG tablet 1 tab po qd x 3d (Patient not taking: Reported on 03/24/2023) 3 tablet 0   ciclopirox (LOPROX) 0.77 % cream Apply topically 2 (two) times daily. (Patient not taking: Reported on 03/24/2023) 30 g 1   clotrimazole (ANTIFUNGAL, CLOTRIMAZOLE,) 1 % cream Apply 1 application topically 2 (two) times daily. (Patient not taking: Reported on 03/24/2023) 60 g 2   selenium sulfide (SELSUN) 2.5 % shampoo Lather over affected area and leave in place for 10 minutes, then rinse. Use for 7-10 days. (Patient not taking: Reported on 03/24/2023) 118 mL 2   No  facility-administered medications prior to visit.    No Known Allergies  Review of Systems As per HPI  PE:    03/24/2023    9:56 AM 03/22/2023    9:36 AM 04/18/2020    2:09 PM  Vitals with BMI  Height     Weight 199 lbs 6 oz 200 lbs 205 lbs 6 oz  BMI 27.82 27.91 28.66  Systolic 124 142 161  Diastolic 82 78 74  Pulse 78 96 60     Physical Exam  Gen: Alert, well appearing.  Patient is oriented to person, place, time, and situation. AFFECT: pleasant, lucid thought and speech. Buttock: Medial aspect of right gluteal surface with mildly erythematous rash with a few pustular vesicles. This does not cross the midline and does not extend  into the anal or genital region.  LABS:  Last CBC Lab Results  Component Value Date   WBC 7.6 03/22/2023   HGB 16.6 03/22/2023   HCT 48.8 03/22/2023   MCV 96.5 03/22/2023   RDW 13.2 03/22/2023   PLT 291.0 03/22/2023   Last metabolic panel Lab Results  Component Value Date   GLUCOSE 92 03/22/2023   NA 135 03/22/2023   K 4.3 03/22/2023   CL 100 03/22/2023   CO2 24 03/22/2023   BUN 11 03/22/2023   CREATININE 1.17 03/22/2023   CALCIUM 9.4 03/22/2023   PROT 7.0 03/22/2023   ALBUMIN 4.8 03/22/2023   BILITOT 1.0 03/22/2023   ALKPHOS 49 03/22/2023   AST 19 03/22/2023   ALT 17 03/22/2023   IMPRESSION AND PLAN:  Herpes zoster with systemic symptoms (aseptic meningitis?) and dehydration. Significantly improving now over the last 12 hours.  Continue Valtrex.  An After Visit Summary was printed and given to the patient.  FOLLOW UP: No follow-ups on file.  Signed:  Santiago Bumpers, MD           03/24/2023

## 2023-08-16 NOTE — Progress Notes (Unsigned)
   Rubin Payor, PhD, LAT, ATC acting as a scribe for Clementeen Graham, MD.  Raffael Farragher is a 47 y.o. male who presents to [facility] at [facility] today for R foot pain x ***. MOI:***. Pt locates pain to   Swelling: Aggravates: Treatments tried:  Dx imaging: 01/14/17 R foot XR  Pertinent review of systems: ***  Relevant historical information: ***   Exam:  There were no vitals taken for this visit. General: Well Developed, well nourished, and in no acute distress.   MSK: ***    Lab and Radiology Results No results found for this or any previous visit (from the past 72 hour(s)). No results found.     Assessment and Plan: 47 y.o. male with ***   PDMP not reviewed this encounter. No orders of the defined types were placed in this encounter.  No orders of the defined types were placed in this encounter.    Discussed warning signs or symptoms. Please see discharge instructions. Patient expresses understanding.   ***

## 2023-08-17 ENCOUNTER — Other Ambulatory Visit: Payer: Self-pay

## 2023-08-17 ENCOUNTER — Ambulatory Visit (INDEPENDENT_AMBULATORY_CARE_PROVIDER_SITE_OTHER): Payer: 59 | Admitting: Family Medicine

## 2023-08-17 VITALS — BP 138/86 | HR 81 | Ht 71.0 in | Wt 200.0 lb

## 2023-08-17 DIAGNOSIS — G8929 Other chronic pain: Secondary | ICD-10-CM

## 2023-08-17 DIAGNOSIS — M79671 Pain in right foot: Secondary | ICD-10-CM | POA: Diagnosis not present

## 2023-08-17 DIAGNOSIS — M722 Plantar fascial fibromatosis: Secondary | ICD-10-CM

## 2023-08-17 MED ORDER — PREDNISONE 50 MG PO TABS
50.0000 mg | ORAL_TABLET | Freq: Every day | ORAL | 0 refills | Status: DC
Start: 2023-08-17 — End: 2023-09-14

## 2023-08-17 MED ORDER — MELOXICAM 15 MG PO TABS
15.0000 mg | ORAL_TABLET | Freq: Every day | ORAL | 3 refills | Status: DC | PRN
Start: 2023-08-17 — End: 2023-09-14

## 2023-08-17 NOTE — Patient Instructions (Addendum)
Thank you for coming in today.   Heel cushion  Ice massage  Plantar fascia night splint.  Please complete the exercises that the athletic trainer went over with you: View at [email or web address] using code: [email or web address]

## 2023-09-07 ENCOUNTER — Ambulatory Visit: Payer: 59 | Admitting: Family Medicine

## 2023-09-14 ENCOUNTER — Ambulatory Visit (INDEPENDENT_AMBULATORY_CARE_PROVIDER_SITE_OTHER): Payer: 59 | Admitting: Family Medicine

## 2023-09-14 ENCOUNTER — Other Ambulatory Visit: Payer: Self-pay

## 2023-09-14 VITALS — BP 118/74 | HR 77 | Ht 71.0 in | Wt 198.0 lb

## 2023-09-14 DIAGNOSIS — M79671 Pain in right foot: Secondary | ICD-10-CM

## 2023-09-14 DIAGNOSIS — M722 Plantar fascial fibromatosis: Secondary | ICD-10-CM | POA: Diagnosis not present

## 2023-09-14 DIAGNOSIS — G8929 Other chronic pain: Secondary | ICD-10-CM

## 2023-09-14 MED ORDER — PREDNISONE 50 MG PO TABS
50.0000 mg | ORAL_TABLET | Freq: Every day | ORAL | 0 refills | Status: DC
Start: 2023-09-14 — End: 2024-04-24

## 2023-09-14 MED ORDER — MELOXICAM 15 MG PO TABS
15.0000 mg | ORAL_TABLET | Freq: Every day | ORAL | 3 refills | Status: AC | PRN
Start: 2023-09-14 — End: ?

## 2023-09-14 NOTE — Progress Notes (Unsigned)
Rubin Payor, PhD, LAT, ATC acting as a scribe for Clementeen Graham, MD.  John Henderson is a 47 y.o. male who presents to [facility] at [facility] today for cont'd R foot pain. Pt was last seen by Dr. Denyse Amass on 08/17/23 and was advised to use heel cushioning, arch straps, ice massage, and was taught HEP. Pt was also prescribed prednisone and meloxicam. He was advised to return about 2-wks prior to his trip to consider possible steroid injection.   Today, pt reports he is leaving for his hunting trip on the 17th and will actually start hunting on the 19th. He has been compliant w/ the treatment plan and HEP. He is nervous about trying an injection today. He never picked up his rx from his pharmacy.  Dx imaging: 01/14/15 R foot XR   Pertinent review of systems: No fevers or chills  Relevant historical information: Otherwise healthy   Exam:  BP 118/74   Pulse 77   Ht 5\' 11"  (1.803 m)   Wt 198 lb (89.8 kg)   SpO2 94%   BMI 27.62 kg/m  General: Well Developed, well nourished, and in no acute distress.   MSK: Right foot normal. Tender palpation plantar fascia at calcaneal insertion    Lab and Radiology Results  Procedure: Real-time Ultrasound Guided Injection of right plantar fascia origin plantar medial calcaneus Device: Philips Affiniti 50G/GE Logiq Images permanently stored and available for review in PACS Verbal informed consent obtained.  Discussed risks and benefits of procedure. Warned about infection, bleeding, hyperglycemia damage to structures among others. Patient expresses understanding and agreement Time-out conducted.   Noted no overlying erythema, induration, or other signs of local infection.   Skin prepped in a sterile fashion.   Local anesthesia: Topical Ethyl chloride.   With sterile technique and under real time ultrasound guidance: 40 mg of Kenalog and 1 mL of Marcaine injected into plantar fascia origin. Fluid seen entering the plantar  fascia.   Completed without difficulty   Pain immediately resolved suggesting accurate placement of the medication.   Advised to call if fevers/chills, erythema, induration, drainage, or persistent bleeding.   Images permanently stored and available for review in the ultrasound unit.  Impression: Technically successful ultrasound guided injection.        Assessment and Plan: 47 y.o. male with right plantar fasciitis.  He is leaving for a hunting trip in about a week.  He is already tried some conservative management and still having some pain.  Plan to continue conservative management and proceed with steroid injection today.  I did prescribe meloxicam and prednisone for use as an emergency backup while away.  Check back as needed.   PDMP not reviewed this encounter. Orders Placed This Encounter  Procedures   [place] LIMITED JOINT SPACE STRUCTURES LOW RIGHT(NO LINKED CHARGES)    Order Specific Question:   Reason for Exam (SYMPTOM  OR DIAGNOSIS REQUIRED)    Answer:   right foot pain    Order Specific Question:   Preferred imaging location?    Answer:   [facility] ordered this encounter  Medications   meloxicam (MOBIC) 15 MG tablet    Sig: Take 1 tablet (15 mg total) by mouth daily as needed.    Dispense:  30 tablet    Refill:  3   predniSONE (DELTASONE) 50 MG tablet    Sig: Take 1 tablet (50 mg total) by mouth daily.    Dispense:  5  tablet    Refill:  0     Discussed warning signs or symptoms. Please see discharge instructions. Patient expresses understanding.   The above documentation has been reviewed and is accurate and complete Clementeen Graham, M.D.

## 2023-09-14 NOTE — Patient Instructions (Addendum)
Thank you for coming in today.   Call or go to the ER if you develop a large red swollen joint with extreme pain or oozing puss.    I resent the medicines to your pharmacy  Happy hunting! Send [place] pictures!

## 2023-09-15 ENCOUNTER — Ambulatory Visit: Payer: 59 | Admitting: Family Medicine

## 2023-09-15 ENCOUNTER — Encounter: Payer: Self-pay | Admitting: Family Medicine

## 2023-09-15 VITALS — BP 121/80 | HR 61 | Temp 98.5°F | Ht 71.0 in | Wt 197.6 lb

## 2023-09-15 DIAGNOSIS — B360 Pityriasis versicolor: Secondary | ICD-10-CM | POA: Diagnosis not present

## 2023-09-15 DIAGNOSIS — M722 Plantar fascial fibromatosis: Secondary | ICD-10-CM | POA: Insufficient documentation

## 2023-09-15 DIAGNOSIS — Z23 Encounter for immunization: Secondary | ICD-10-CM

## 2023-09-15 MED ORDER — SELENIUM SULFIDE 2.5 % EX LOTN: TOPICAL_LOTION | CUTANEOUS | 5 refills | Status: AC

## 2023-09-15 MED ORDER — CLOTRIMAZOLE 1 % EX CREA: 1.0000 | TOPICAL_CREAM | Freq: Two times a day (BID) | CUTANEOUS | 5 refills | Status: AC

## 2023-09-15 MED ORDER — SELENIUM SULFIDE 2.25 % EX SHAM: MEDICATED_SHAMPOO | CUTANEOUS | 6 refills | Status: AC

## 2023-09-15 NOTE — Progress Notes (Signed)
OFFICE VISIT  09/15/2023  CC:  Chief Complaint  Patient presents with   Skin Concern    Pt was last seen 06/11/21 virtually for skin concern, rx'd clotrimazole 1% cream and Selenium Sulfide 2.5% shampoo.     Patient is a 47 y.o. male who presents for skin concern.  HPI: Off and on for several years John Henderson has had waxing and waning macules scattered over the back and shoulders and some on the arms. They itch.  The spots are lighter in the summer and darker in the winter. Clotrimazole cream and selenium sulfide lotion and shampoo have helped in the past.  However, he has not been able to completely eradicate it.   Past Medical History:  Diagnosis Date   COVID-19 virus infection 08/2019   GERD with esophagitis 04/2020   Hay fever    No hx of asthma   History of adenomatous polyp of colon    09/2022, recall 5 yrs   Inguinal hernia, left 06/2017   Referred to Gen Surg 06/13/17   Plantar fasciitis    Recurrent sinusitis    Right shoulder pain 11/2018   RC tendonopathy, scapular dyskinesis (Dr. Berline Chough).   Tinnitus aurium, bilateral    Venous insufficiency    Left leg varicose vein dz   Vertigo     Past Surgical History:  Procedure Laterality Date   ADENOIDECTOMY     COLONOSCOPY  09/22/2022   09/2022 Several adenomatous polyps.  recall 5 yrs   Duplex Ultrasound Study  12/26/2019   Status post prior left leg great saphenous vein EVLT   ESOPHAGOGASTRODUODENOSCOPY  09/22/2022   antral gastritis   FOOT SURGERY Right    tendon re-attachment after laceration   TONSILLECTOMY  approx 2005    Outpatient Medications Prior to Visit  Medication Sig Dispense Refill   famotidine-calcium carbonate-magnesium hydroxide (PEPCID COMPLETE) 10-800-165 MG chewable tablet Chew 1 tablet by mouth daily as needed.     meloxicam (MOBIC) 15 MG tablet Take 1 tablet (15 mg total) by mouth daily as needed. 30 tablet 3   Omeprazole Magnesium (PRILOSEC OTC PO) Take 1 tablet by mouth as needed.       predniSONE (DELTASONE) 50 MG tablet Take 1 tablet (50 mg total) by mouth daily. 5 tablet 0   valACYclovir (VALTREX) 1000 MG tablet Take 1,000 mg by mouth 3 (three) times daily.     No facility-administered medications prior to visit.    No Known Allergies  Review of Systems  As per HPI  PE:    09/15/2023    2:09 PM 09/14/2023    3:41 PM 08/17/2023    8:11 AM  Vitals with BMI  Height 5\' 11"  5\' 11"  5\' 11"   Weight 197 lbs 10 oz 198 lbs 200 lbs  BMI 27.57 27.63 27.91  Systolic 121 118 161  Diastolic 80 74 86  Pulse 61 77 81     Physical Exam  Gen: Alert, well appearing.  Patient is oriented to person, place, time, and situation. AFFECT: pleasant, lucid thought and speech. Skin: Mid and upper back and back of his neck have scattered 1 to 2 cm hypopigmented oval macules.  Slight superficial desquamation.  LABS:  none  IMPRESSION AND PLAN:  Tinea versicolor. Prescribed clotrimazole 1% cream twice daily to affected area. Also selenium sulfide shampoo 2.25% to apply to scalp 3 days a week. Selenium sulfide lotion 2.5% to apply daily for 10 days. Try to avoid repetitive use of moist towels.  An After Visit  Summary was printed and given to the patient.  FOLLOW UP: Return if symptoms worsen or fail to improve.  Signed:  Santiago Bumpers, MD           09/15/2023

## 2024-04-19 ENCOUNTER — Ambulatory Visit: Admitting: Family Medicine

## 2024-04-19 NOTE — Progress Notes (Deleted)
   Joanna Muck, PhD, LAT, ATC acting as a scribe for Garlan Juniper, MD.  John Henderson is a 48 y.o. male who presents to [facility] at [facility] today for cont'd R foot pain. Pt was last seen by Dr. Alease Hunter on 09/14/23 and was given a R plantar fascial origin steroid injection.  Today, pt reports ***  Dx imaging: 01/14/15 R foot XR  Pertinent review of systems: ***  Relevant historical information: ***   Exam:  There were no vitals taken for this visit. General: Well Developed, well nourished, and in no acute distress.   MSK: ***    Lab and Radiology Results No results found for this or any previous visit (from the past 72 hours). No results found.     Assessment and Plan: 48 y.o. male with ***   PDMP not reviewed this encounter. No orders of the defined types were placed in this encounter.  No orders of the defined types were placed in this encounter.    Discussed warning signs or symptoms. Please see discharge instructions. Patient expresses understanding.   ***

## 2024-04-21 NOTE — Progress Notes (Signed)
   I, John Henderson am a scribe for Dr. Alease Hunter.   John Henderson is a 48 y.o. male who presents to [facility] at [facility] today for cont'd R foot pain. Pt was last seen by Dr. Alease Hunter on 09/14/23 and was given a R plantar fascial origin steroid injection.  Today, pt reports the shot worked for a couple of months and now it is back. It switches from the plantar fascitis pain to just feel pain.   His disown shot last October was in preparation for an Keane Passe which was very successful.  He wants to do it again this upcoming October.  Dx imaging: 01/14/15 R foot XR  Pertinent review of systems: No fevers or chills  Relevant historical information: Plantar fasciitis and Achilles tendinitis right foot.   Exam:  BP 122/60   Pulse 69   Ht 5\' 11"  (1.803 m)   Wt 199 lb 6.4 oz (90.4 kg)   SpO2 99%   BMI 27.81 kg/m  General: Well Developed, well nourished, and in no acute distress.   MSK: Right foot and ankle normal-appearing Tender palpation plantar calcaneus and posterior calcaneus.    Lab and Radiology Results                Extracorporeal Shockwave Therapy Note    Patient is being treated today with ECSWT. Informed consent was obtained and patient tolerated procedure well.   Therapy performed by Garlan Juniper  Condition treated: Right Achilles tendinitis and plantar fasciitis Treatment preset used: Tendinitis Energy used: 120 mJ Frequency used: 15 Hz Number of pulses: 4000 total 2000 each site Head Size: Medium Treatment #1 of #4  Electronically signed by:  [facility] 9:40 AM 04/24/24      Assessment and Plan: 48 y.o. male with chronic right plantar fasciitis and Achilles tendinitis.  Marginal success with conservative management.  Plan to maximize conservative management with continued home exercise program and night splint.  Will start shockwave therapy today.  For shockwave trial was a success.  No charge for shockwave  today as it was a trial but plan to start formal shockwave treatments next week with a total of 4 treatments.  Additionally will start nitroglycerin  patch protocol.   PDMP not reviewed this encounter. No orders of the defined types were placed in this encounter.  Meds ordered this encounter  Medications   nitroGLYCERIN  (NITRODUR - DOSED IN MG/24 HR) 0.2 mg/hr patch    Sig: Place 1/4 to 1/2 of a patch over affected region. Remove and replace once daily.  Slightly alter skin placement daily    Dispense:  30 patch    Refill:  1    For musculoskeletal purposes.  Okay to cut patch.     Discussed warning signs or symptoms. Please see discharge instructions. Patient expresses understanding.   The above documentation has been reviewed and is accurate and complete Garlan Juniper, M.D.

## 2024-04-24 ENCOUNTER — Ambulatory Visit: Admitting: Family Medicine

## 2024-04-24 VITALS — BP 122/60 | HR 69 | Ht 71.0 in | Wt 199.4 lb

## 2024-04-24 DIAGNOSIS — M7661 Achilles tendinitis, right leg: Secondary | ICD-10-CM | POA: Insufficient documentation

## 2024-04-24 DIAGNOSIS — M722 Plantar fascial fibromatosis: Secondary | ICD-10-CM

## 2024-04-24 MED ORDER — NITROGLYCERIN 0.2 MG/HR TD PT24
MEDICATED_PATCH | TRANSDERMAL | 1 refills | Status: DC
Start: 2024-04-24 — End: 2024-05-02

## 2024-04-24 NOTE — Patient Instructions (Addendum)
 Thank you for coming in today.   Nitroglycerin  Protocol  Apply 1/4 nitroglycerin  patch to affected area daily. Change position of patch within the affected area every 24 hours. You may experience a headache during the first 1-2 weeks of using the patch, these should subside. If you experience headaches after beginning nitroglycerin  patch treatment, you may take your preferred over the counter pain reliever. Another side effect of the nitroglycerin  patch is skin irritation or rash related to patch adhesive. Please notify our office if you develop more severe headaches or rash, and stop the patch. Tendon healing with nitroglycerin  patch may require 12 to 24 weeks depending on the extent of injury. Men should not use if taking Viagra, Cialis, or Levitra.  Do not use if you have migraines or rosacea.    Shock wave therapy done today.

## 2024-05-02 ENCOUNTER — Ambulatory Visit: Payer: Self-pay | Admitting: Family Medicine

## 2024-05-02 DIAGNOSIS — M7661 Achilles tendinitis, right leg: Secondary | ICD-10-CM

## 2024-05-02 DIAGNOSIS — M722 Plantar fascial fibromatosis: Secondary | ICD-10-CM

## 2024-05-02 MED ORDER — NITROGLYCERIN 0.2 MG/HR TD PT24: MEDICATED_PATCH | TRANSDERMAL | 1 refills | Status: AC

## 2024-05-02 MED ORDER — GABAPENTIN 300 MG PO CAPS
300.0000 mg | ORAL_CAPSULE | Freq: Every evening | ORAL | 3 refills | Status: AC | PRN
Start: 2024-05-02 — End: ?

## 2024-05-03 NOTE — Progress Notes (Signed)
   [facility] [address] [place] [address] Phone: [phone]   Extracorporeal Shockwave Therapy Note    Patient is being treated today with ECSWT. Informed consent was obtained and patient tolerated procedure well.   Therapy performed by Garlan Juniper  Condition treated: Right foot plantar fasciitis and Achilles tendinitis Treatment preset used: Tendinitis Energy used: 100 mJ Frequency used: 15 Hz Number of pulses: 4000 total.  2000 each site. Head Size: Medium Treatment #2 of #4  Electronically signed by:  [facility] 6:53 AM 05/03/24

## 2024-05-09 ENCOUNTER — Ambulatory Visit: Payer: Self-pay | Admitting: Family Medicine

## 2024-05-09 DIAGNOSIS — M722 Plantar fascial fibromatosis: Secondary | ICD-10-CM

## 2024-05-09 DIAGNOSIS — M7661 Achilles tendinitis, right leg: Secondary | ICD-10-CM

## 2024-05-09 NOTE — Progress Notes (Signed)
   [facility] [address] [place] [address] Phone: [phone]   Extracorporeal Shockwave Therapy Note    Patient is being treated today with ECSWT. Informed consent was obtained and patient tolerated procedure well.   Therapy performed by Garlan Juniper  Condition treated: Right plantar fasciitis and Achilles tendinitis Treatment preset used: Plantar fasciitis Energy used: 100 mJ Frequency used: 15 Hz Number of pulses: 4000 total 2000 each site Head Size: Medium Treatment #3 of #3-4  Electronically signed by:  [facility] 5:49 PM 05/09/24
